# Patient Record
Sex: Female | Born: 1985
Health system: Southern US, Community
[De-identification: ages and names within clinical notes are randomized; demographics above are authoritative.]

## PROBLEM LIST (undated history)

## (undated) ENCOUNTER — Inpatient Hospital Stay (HOSPITAL_COMMUNITY): Payer: Self-pay

## (undated) DIAGNOSIS — K5900 Constipation, unspecified: Secondary | ICD-10-CM

## (undated) DIAGNOSIS — R0602 Shortness of breath: Secondary | ICD-10-CM

## (undated) DIAGNOSIS — I10 Essential (primary) hypertension: Secondary | ICD-10-CM

## (undated) DIAGNOSIS — K829 Disease of gallbladder, unspecified: Secondary | ICD-10-CM

## (undated) DIAGNOSIS — Z789 Other specified health status: Secondary | ICD-10-CM

## (undated) HISTORY — DX: Disease of gallbladder, unspecified: K82.9

## (undated) HISTORY — DX: Shortness of breath: R06.02

## (undated) HISTORY — DX: Essential (primary) hypertension: I10

## (undated) HISTORY — DX: Constipation, unspecified: K59.00

---

## 2008-11-17 HISTORY — PX: CHOLECYSTECTOMY: SHX55

## 2013-04-28 ENCOUNTER — Encounter (HOSPITAL_COMMUNITY): Payer: Self-pay | Admitting: Family Medicine

## 2013-04-28 ENCOUNTER — Emergency Department (HOSPITAL_COMMUNITY)
Admission: EM | Admit: 2013-04-28 | Discharge: 2013-04-28 | Disposition: A | Discharge status: Home / Self Care | Payer: Medicaid Other | Attending: Emergency Medicine | Admitting: Emergency Medicine

## 2013-04-28 DIAGNOSIS — R1310 Dysphagia, unspecified: Secondary | ICD-10-CM | POA: Insufficient documentation

## 2013-04-28 DIAGNOSIS — J029 Acute pharyngitis, unspecified: Secondary | ICD-10-CM | POA: Insufficient documentation

## 2013-04-28 LAB — RAPID STREP SCREEN (MED CTR MEBANE ONLY): Streptococcus, Group A Screen (Direct): NEGATIVE

## 2013-04-28 MED ORDER — HYDROMORPHONE HCL 2 MG PO TABS: 2.0000 mg | ORAL_TABLET | ORAL | Status: DC | PRN

## 2013-04-28 MED ORDER — DEXAMETHASONE 10 MG/ML FOR PEDIATRIC ORAL USE
10.0000 mg | Freq: Once | INTRAMUSCULAR | Status: AC
  Administered 2013-04-28: 10 mg via ORAL
  Filled 2013-04-28: qty 1

## 2013-04-28 MED ORDER — HYDROMORPHONE HCL 2 MG PO TABS
2.0000 mg | ORAL_TABLET | Freq: Once | ORAL | Status: AC
  Administered 2013-04-28: 2 mg via ORAL
  Filled 2013-04-28: qty 1

## 2013-04-28 MED ORDER — OXYCODONE-ACETAMINOPHEN 5-325 MG PO TABS
1.0000 | ORAL_TABLET | Freq: Once | ORAL | Status: AC
  Administered 2013-04-28: 1 via ORAL
  Filled 2013-04-28: qty 1

## 2013-04-28 NOTE — ED Provider Notes (Signed)
History     CSN: [id number]  Arrival date & time 04/28/13  0305   First MD Initiated Contact with Patient 04/28/13 0335      Chief Complaint  Patient presents with  . Sore Throat    (Consider location/radiation/quality/duration/timing/severity/associated sxs/prior treatment) Patient is a 27 y.o. female presenting with pharyngitis. The history is provided by the patient.  Sore Throat  She had onset 2 days ago of sore throat. Pain is severe and she rates it 10/10. It is worse with taking a deep breath or with swallowing. She denies fever, chills, sweats. She tried taking acetaminophen and ibuprofen with no relief of pain. She tried using throat numbing agents which did not help her. She denies any sick contacts. She denies any rhinorrhea or cough.  History reviewed. No pertinent past medical history.  Past Surgical History  Procedure Laterality Date  . Cholecystectomy  2010    Family History  Problem Relation Age of Onset  . Hypertension Mother   . Diabetes Mother   . Hypertension Father     History  Substance Use Topics  . Smoking status: Not on file  . Smokeless tobacco: Not on file  . Alcohol Use: Not on file    OB History   Grav Para Term Preterm Abortions TAB SAB Ect Mult Living                  Review of Systems  All other systems reviewed and are negative.    Allergies  Review of patient's allergies indicates no known allergies.  Home Medications   Current Outpatient Rx  Name  Route  Sig  Dispense  Refill  . Acetaminophen (TYLENOL 8 HOUR PO)   Oral   Take 1 tablet by mouth every 6 (six) hours as needed (pain).         Marland Kitchen ibuprofen (ADVIL,MOTRIN) 200 MG tablet   Oral   Take 200 mg by mouth every 6 (six) hours as needed for pain.         Marland Kitchen OVER THE COUNTER MEDICATION   Oral   Take 2 sprays by mouth every 2 (two) hours as needed (sore throat). Spray for sore throat           BP 144/95  Pulse 72  Temp(Src) 97.8 F (36.6 C) (Oral)   Resp 20  SpO2 99%  LMP 04/18/2013  Physical Exam  Nursing note and vitals reviewed.  27 year old female, resting comfortably and in no acute distress. Vital signs are significant for mild hypertension with blood pressure 144/95. Oxygen saturation is 99%, which is normal. Head is normocephalic and atraumatic. PERRLA, EOMI. Oropharynx shows mild tonsillar erythema and hypertrophy with small areas of exudate are present. She has no difficulty with secretions and phonation is normal. Neck is nontender and supple without or JVD. Bilateral submandibular adenopathy is present Back is nontender and there is no CVA tenderness. Lungs are clear without rales, wheezes, or rhonchi. Chest is nontender. Heart has regular rate and rhythm without murmur. Abdomen is soft, flat, nontender without masses or hepatosplenomegaly and peristalsis is normoactive. Extremities have no cyanosis or edema, full range of motion is present. Skin is warm and dry without rash. Neurologic: Mental status is normal, cranial nerves are intact, there are no motor or sensory deficits.  ED Course  Procedures (including critical care time)  Results for orders placed during the hospital encounter of 04/28/13  RAPID STREP SCREEN      Result Value Range  Streptococcus, Group A Screen (Direct) NEGATIVE  NEGATIVE    1. Viral pharyngitis       MDM  Pharyngitis which is most likely viral. Rapid strep screen will be obtained. She'll be given a dose of dexamethasone. Antibiotics will be withheld unless strep screen is positive.  Strep screen is negative. Culture is pending. She was given a dose of acetaminophen-oxycodone but got no relief of pain. She is given a dose of hydromorphone orally with good relief of pain. She is discharged with a prescription for hydromorphone which is to take as needed.  Dione Booze, MD 04/28/13 [phone]

## 2013-04-28 NOTE — ED Notes (Signed)
Pt comfortable with d/c and f/u isntructions. Prescriptions x1

## 2013-04-28 NOTE — ED Notes (Signed)
Pt reports sharp pain in throat made worse when breathing/coughing x1 day.

## 2013-04-28 NOTE — ED Notes (Signed)
Dr. Glick at bedside.  

## 2013-04-30 LAB — CULTURE, GROUP A STREP

## 2014-01-19 ENCOUNTER — Emergency Department (HOSPITAL_COMMUNITY): Payer: Medicaid Other

## 2014-01-19 ENCOUNTER — Emergency Department (HOSPITAL_COMMUNITY)
Admission: EM | Admit: 2014-01-19 | Discharge: 2014-01-19 | Disposition: A | Discharge status: Home / Self Care | Payer: Medicaid Other | Attending: Emergency Medicine | Admitting: Emergency Medicine

## 2014-01-19 ENCOUNTER — Encounter (HOSPITAL_COMMUNITY): Payer: Self-pay | Admitting: Emergency Medicine

## 2014-01-19 DIAGNOSIS — Z79899 Other long term (current) drug therapy: Secondary | ICD-10-CM | POA: Insufficient documentation

## 2014-01-19 DIAGNOSIS — Z9089 Acquired absence of other organs: Secondary | ICD-10-CM | POA: Insufficient documentation

## 2014-01-19 DIAGNOSIS — R112 Nausea with vomiting, unspecified: Secondary | ICD-10-CM | POA: Insufficient documentation

## 2014-01-19 DIAGNOSIS — F17200 Nicotine dependence, unspecified, uncomplicated: Secondary | ICD-10-CM | POA: Insufficient documentation

## 2014-01-19 DIAGNOSIS — R109 Unspecified abdominal pain: Secondary | ICD-10-CM | POA: Insufficient documentation

## 2014-01-19 LAB — COMPREHENSIVE METABOLIC PANEL
ALK PHOS: 50 U/L (ref 39–117)
ALT: 11 U/L (ref 0–35)
AST: 16 U/L (ref 0–37)
Albumin: 4.4 g/dL (ref 3.5–5.2)
BUN: 7 mg/dL (ref 6–23)
CO2: 22 meq/L (ref 19–32)
Calcium: 9.2 mg/dL (ref 8.4–10.5)
Chloride: 101 mEq/L (ref 96–112)
Creatinine, Ser: 0.6 mg/dL (ref 0.50–1.10)
GLUCOSE: 95 mg/dL (ref 70–99)
POTASSIUM: 3.4 meq/L — AB (ref 3.7–5.3)
SODIUM: 138 meq/L (ref 137–147)
Total Bilirubin: 0.7 mg/dL (ref 0.3–1.2)
Total Protein: 7.1 g/dL (ref 6.0–8.3)

## 2014-01-19 LAB — CBC WITH DIFFERENTIAL/PLATELET
Basophils Absolute: 0 10*3/uL (ref 0.0–0.1)
Basophils Relative: 0 % (ref 0–1)
EOS PCT: 1 % (ref 0–5)
Eosinophils Absolute: 0.1 10*3/uL (ref 0.0–0.7)
HCT: 35.9 % — ABNORMAL LOW (ref 36.0–46.0)
HEMOGLOBIN: 11.7 g/dL — AB (ref 12.0–15.0)
LYMPHS ABS: 1.6 10*3/uL (ref 0.7–4.0)
LYMPHS PCT: 17 % (ref 12–46)
MCH: 29.5 pg (ref 26.0–34.0)
MCHC: 32.6 g/dL (ref 30.0–36.0)
MCV: 90.4 fL (ref 78.0–100.0)
MONOS PCT: 6 % (ref 3–12)
Monocytes Absolute: 0.6 10*3/uL (ref 0.1–1.0)
NEUTROS PCT: 77 % (ref 43–77)
Neutro Abs: 7.5 10*3/uL (ref 1.7–7.7)
PLATELETS: 252 10*3/uL (ref 150–400)
RBC: 3.97 MIL/uL (ref 3.87–5.11)
RDW: 13.2 % (ref 11.5–15.5)
WBC: 9.8 10*3/uL (ref 4.0–10.5)

## 2014-01-19 LAB — URINE MICROSCOPIC-ADD ON

## 2014-01-19 LAB — URINALYSIS, ROUTINE W REFLEX MICROSCOPIC
BILIRUBIN URINE: NEGATIVE
Glucose, UA: NEGATIVE mg/dL
Hgb urine dipstick: NEGATIVE
KETONES UR: NEGATIVE mg/dL
NITRITE: NEGATIVE
PROTEIN: NEGATIVE mg/dL
Specific Gravity, Urine: 1.019 (ref 1.005–1.030)
UROBILINOGEN UA: 1 mg/dL (ref 0.0–1.0)
pH: 8 (ref 5.0–8.0)

## 2014-01-19 LAB — LIPASE, BLOOD: Lipase: 16 U/L (ref 11–59)

## 2014-01-19 MED ORDER — SODIUM CHLORIDE 0.9 % IV SOLN
1000.0000 mL | INTRAVENOUS | Status: DC
  Administered 2014-01-19: 1000 mL via INTRAVENOUS

## 2014-01-19 MED ORDER — IOHEXOL 300 MG/ML  SOLN
100.0000 mL | Freq: Once | INTRAMUSCULAR | Status: AC | PRN
  Administered 2014-01-19: 100 mL via INTRAVENOUS

## 2014-01-19 MED ORDER — ONDANSETRON HCL 4 MG/2ML IJ SOLN
4.0000 mg | Freq: Once | INTRAMUSCULAR | Status: AC
  Administered 2014-01-19: 4 mg via INTRAVENOUS
  Filled 2014-01-19: qty 2

## 2014-01-19 MED ORDER — ONDANSETRON HCL 4 MG/2ML IJ SOLN: 4.0000 mg | Freq: Once | INTRAMUSCULAR | Status: DC

## 2014-01-19 MED ORDER — HYDROMORPHONE HCL PF 1 MG/ML IJ SOLN
1.0000 mg | Freq: Once | INTRAMUSCULAR | Status: AC
  Administered 2014-01-19: 1 mg via INTRAVENOUS
  Filled 2014-01-19: qty 1

## 2014-01-19 MED ORDER — MORPHINE SULFATE 4 MG/ML IJ SOLN
4.0000 mg | Freq: Once | INTRAMUSCULAR | Status: AC
  Administered 2014-01-19: 4 mg via INTRAVENOUS
  Filled 2014-01-19: qty 1

## 2014-01-19 MED ORDER — ONDANSETRON 8 MG PO TBDP: 8.0000 mg | ORAL_TABLET | Freq: Three times a day (TID) | ORAL | Status: DC | PRN

## 2014-01-19 MED ORDER — IOHEXOL 300 MG/ML  SOLN
50.0000 mL | Freq: Once | INTRAMUSCULAR | Status: AC | PRN
  Administered 2014-01-19: 50 mL via ORAL

## 2014-01-19 MED ORDER — OXYCODONE-ACETAMINOPHEN 5-325 MG PO TABS: 1.0000 | ORAL_TABLET | ORAL | Status: DC | PRN

## 2014-01-19 MED ORDER — SODIUM CHLORIDE 0.9 % IV SOLN
1000.0000 mL | Freq: Once | INTRAVENOUS | Status: AC
  Administered 2014-01-19: 1000 mL via INTRAVENOUS

## 2014-01-19 NOTE — ED Provider Notes (Signed)
CSN: [id number]     Arrival date & time 01/19/14  1445 History   First MD Initiated Contact with Patient 01/19/14 1506     Chief Complaint  Patient presents with  . Abdominal Pain      HPI Patient brought to the emergency department because of some upper abdominal pain over the past 3 days with associated nausea and vomiting.  She is status post cholecystectomy many years ago.  She denies diarrhea.  She reports her pain is moderate to severe in severity at this time.  She denies hematemesis.  No melena or hematochezia.  No lower abdominal pain.  No urinary complaints.  No back pain.  No chest pain or shortness of breath.   History reviewed. No pertinent past medical history. Past Surgical History  Procedure Laterality Date  . Cholecystectomy  2010   Family History  Problem Relation Age of Onset  . Hypertension Mother   . Diabetes Mother   . Hypertension Father    History  Substance Use Topics  . Smoking status: Current Every Day Smoker  . Smokeless tobacco: Not on file  . Alcohol Use: No   OB History   Grav Para Term Preterm Abortions TAB SAB Ect Mult Living                 Review of Systems  All other systems reviewed and are negative.      Allergies  Review of patient's allergies indicates no known allergies.  Home Medications   Current Outpatient Rx  Name  Route  Sig  Dispense  Refill  . Levonorgestrel (MIRENA IU)   Intrauterine   1 Device by Intrauterine route daily.         . ondansetron (ZOFRAN ODT) 8 MG disintegrating tablet   Oral   Take 1 tablet (8 mg total) by mouth every 8 (eight) hours as needed for nausea or vomiting.   10 tablet   0   . oxyCODONE-acetaminophen (PERCOCET/ROXICET) 5-325 MG per tablet   Oral   Take 1 tablet by mouth every 4 (four) hours as needed for severe pain.   20 tablet   0    BP 122/61  Pulse 79  Temp(Src) 97.8 F (36.6 C) (Oral)  Resp 18  SpO2 99%  LMP 01/05/2014 Physical Exam  Nursing note and vitals  reviewed. Constitutional: She is oriented to person, place, and time. She appears well-developed and well-nourished. No distress.  HENT:  Head: Normocephalic and atraumatic.  Eyes: EOM are normal.  Neck: Normal range of motion.  Cardiovascular: Normal rate, regular rhythm and normal heart sounds.   Pulmonary/Chest: Effort normal and breath sounds normal.  Abdominal: Soft. She exhibits no distension.  Upper abdominal tenderness without guarding or rebound  Musculoskeletal: Normal range of motion.  Neurological: She is alert and oriented to person, place, and time.  Skin: Skin is warm and dry.  Psychiatric: She has a normal mood and affect. Judgment normal.    ED Course  Procedures (including critical care time) Labs Review Labs Reviewed  CBC WITH DIFFERENTIAL - Abnormal; Notable for the following:    Hemoglobin 11.7 (*)    HCT 35.9 (*)    All other components within normal limits  COMPREHENSIVE METABOLIC PANEL - Abnormal; Notable for the following:    Potassium 3.4 (*)    All other components within normal limits  URINALYSIS, ROUTINE W REFLEX MICROSCOPIC - Abnormal; Notable for the following:    APPearance CLOUDY (*)    Leukocytes, UA  MODERATE (*)    All other components within normal limits  URINE MICROSCOPIC-ADD ON - Abnormal; Notable for the following:    Bacteria, UA FEW (*)    All other components within normal limits  URINE CULTURE  LIPASE, BLOOD  POC URINE PREG, ED   Imaging Review Ct Abdomen Pelvis W Contrast  01/19/2014   CLINICAL DATA:  Abdominal pain for 3 days.  EXAM: CT ABDOMEN AND PELVIS WITH CONTRAST  TECHNIQUE: Multidetector CT imaging of the abdomen and pelvis was performed using the standard protocol following bolus administration of intravenous contrast.  CONTRAST:  26mL OMNIPAQUE IOHEXOL 300 MG/ML SOLN, 154mL OMNIPAQUE IOHEXOL 300 MG/ML SOLN  COMPARISON:  None available for comparison at time of study interpretation.  FINDINGS: Included view of the lung bases  are clear. Visualized heart and pericardium are unremarkable.  Mild intrahepatic biliary dilatation, status post cholecystectomy ; the liver is otherwise unremarkable. Spleen, pancreas and adrenal glands are unremarkable.  The stomach, small and large bowel are normal in course and caliber without inflammatory changes. Normal appendix. Small amount of free fluid in the pelvis, as well as left pericolic gutter with scattered subcentimeter intraperitoneal/peritoneal calcifications. Slightly thickened possibly reactive changes of the omentum along the left anterolateral abdomen.  Kidneys are orthotopic, demonstrating symmetric enhancement without nephrolithiasis, hydronephrosis or renal masses. The unopacified ureters are normal in course and caliber. Delayed imaging through the kidneys demonstrates symmetric prompt excretion to the proximal urinary collecting system. Urinary bladder is partially distended and unremarkable.  Great vessels are normal in course and caliber. No lymphadenopathy by CT size criteria. Intrauterine device in place. Left adnexal 3.5 x 2.6 cm mass with suspected fat fluid level. Soft tissues and included osseous structures are nonsuspicious. Mild broad levoscoliosis could be positional  IMPRESSION: Left adnexal 3.5 x 2.6 cm mass with suspected fat fluid level which may reflect a dermoid. Small amount of ascites could reflect ruptured adnexal cyst.  Scattered peritoneal calcifications associated with some probable reactive changes of the omentum, age indeterminate. Though the calcification could reflect congenital insult, prior infectious or inflammatory process, findings are atypical and follow-up is recommended.  Findings discussed with and reconfirmed by Dr.Katheryne Gorr on 01/19/2014 6:31 PM.   Electronically Signed   By: Elon Alas   On: 01/19/2014 18:55  I personally reviewed the imaging tests through PACS system I reviewed available ER/hospitalization records through the EMR     EKG Interpretation None      MDM   Final diagnoses:  Abdominal pain   7:36 PM Patient feels much better at this time.  Discharge with pain medicine nausea medicine.  Patient is status post cholecystectomy.  Upper abdomen is normal.  She has abnormal left adnexal mass.  She understands the extreme importance for follow up with OB/GYN.  There is also some abnormal scattered peritoneal calcifications and some changes to the omentum which are concerning.  She understands the importance of followup and understands that this could represent changes consistent with cancer and understands that followup is the necessary steps she needs to take to further define her problem in her lower abdomen.  Documents that her lower adnexal issues are related to her symptoms today.    Hoy Morn, MD 01/19/14 [phone]

## 2014-01-19 NOTE — Progress Notes (Signed)
   CARE MANAGEMENT ED NOTE 01/19/2014  Patient:  Toni Sanchez, Toni Sanchez   Account Number:  [id number]  Date Initiated:  01/19/2014  Documentation initiated by:  Livia Snellen  Subjective/Objective Assessment:   Patient presents to ED abdominal pain and n/v for three days     Subjective/Objective Assessment Detail:     Action/Plan:   Action/Plan Detail:   Anticipated DC Date:       Status Recommendation to Physician:   Result of Recommendation:    Other ED [place]  Other  PCP issues    Choice offered to / List presented to:            Status of service:  Completed, signed off  ED Comments:   ED Comments Detail:  Patient confirms her pcp is at [facility]. System updated.

## 2014-01-19 NOTE — ED Notes (Signed)
Per [facility] upper right and left abdominal pain for 3 days-N/V no diarrhea-BP elevated

## 2014-01-20 LAB — URINE CULTURE
COLONY COUNT: NO GROWTH
CULTURE: NO GROWTH

## 2014-02-27 ENCOUNTER — Encounter: Payer: Self-pay | Admitting: Family Medicine

## 2014-02-27 ENCOUNTER — Other Ambulatory Visit (HOSPITAL_COMMUNITY)
Admission: RE | Admit: 2014-02-27 | Discharge: 2014-02-27 | Disposition: A | Discharge status: Home / Self Care | Payer: Medicaid Other | Source: Ambulatory Visit | Attending: Family Medicine | Admitting: Family Medicine

## 2014-02-27 ENCOUNTER — Ambulatory Visit (INDEPENDENT_AMBULATORY_CARE_PROVIDER_SITE_OTHER): Payer: Medicaid Other | Admitting: Family Medicine

## 2014-02-27 VITALS — BP 134/95 | HR 91 | Temp 98.9°F | Ht 69.0 in | Wt 222.1 lb

## 2014-02-27 DIAGNOSIS — Z01419 Encounter for gynecological examination (general) (routine) without abnormal findings: Secondary | ICD-10-CM

## 2014-02-27 DIAGNOSIS — Z113 Encounter for screening for infections with a predominantly sexual mode of transmission: Secondary | ICD-10-CM | POA: Insufficient documentation

## 2014-02-27 DIAGNOSIS — D271 Benign neoplasm of left ovary: Secondary | ICD-10-CM

## 2014-02-27 DIAGNOSIS — Z124 Encounter for screening for malignant neoplasm of cervix: Secondary | ICD-10-CM | POA: Insufficient documentation

## 2014-02-27 DIAGNOSIS — D279 Benign neoplasm of unspecified ovary: Secondary | ICD-10-CM

## 2014-02-27 NOTE — Progress Notes (Signed)
GYN OFFICE NOTE  Chief Complaint:  LLQ pain  Primary Care Physician: PROVIDER NOT IN SYSTEM  HPI:  Toni Sanchez is a 28 yo G1P1001 who presents today as an ED f/u for left lower quadrant abdominal pain.   - was seen in the ED 3/5  - had a CT at that time that showed 3.5x2.6cm mass with suspected fat fluid level taht may reflect a dermoid  Pain is worst 2 days before menstrual cycle, through her menstural cycle and then 2-3days after. In total about 1 week out of every month Nothing seems to make it better or worse -sharp pains that come and go   Has a hx of irregular periods but now have been regular. Every month. Very light flow. Last 3 days.   Sexually active. One partner. Has a mirena in place.   No fevers, chills, nausea, vomiting, diarrhea, constipation, chest pain, sob, headaches.    PMHx:  History reviewed. No pertinent past medical history.  Past Surgical History  Procedure Laterality Date  . Cholecystectomy  2010    FAMHx:  Family History  Problem Relation Age of Onset  . Hypertension Mother   . Diabetes Mother   . Hypertension Father     SOCHx:   reports that she has been smoking Cigarettes.  She has been smoking about 0.50 packs per day. She has never used smokeless tobacco. She reports that she uses illicit drugs (Marijuana). She reports that she does not drink alcohol.  ALLERGIES:  No Known Allergies  ROS: Pertinent ROS as seen in HPI. Otherwise negative.   HOME MEDS: Current Outpatient Prescriptions  Medication Sig Dispense Refill  . Levonorgestrel (MIRENA IU) 1 Device by Intrauterine route daily.      . ondansetron (ZOFRAN ODT) 8 MG disintegrating tablet Take 1 tablet (8 mg total) by mouth every 8 (eight) hours as needed for nausea or vomiting.  10 tablet  0  . oxyCODONE-acetaminophen (PERCOCET/ROXICET) 5-325 MG per tablet Take 1 tablet by mouth every 4 (four) hours as needed for severe pain.  20 tablet  0   No current facility-administered  medications for this visit.    LABS/IMAGING: 01/19/14 CT abd/pelvis IMPRESSION:   Left adnexal 3.5 x 2.6 cm mass with suspected fat fluid level which  may reflect a dermoid. Small amount of ascites could reflect  ruptured adnexal cyst.   Scattered peritoneal calcifications associated with some probable  reactive changes of the omentum, age indeterminate. Though the  calcification could reflect congenital insult, prior infectious or  inflammatory process, findings are atypical and follow-up is  recommended.    VITALS: BP 134/95  Pulse 91  Temp(Src) 98.9 F (37.2 C)  Ht 5\' 9"  (1.753 m)  Wt 222 lb 1.6 oz (100.744 kg)  BMI 32.78 kg/m2  LMP 02/07/2014  EXAM: GENERAL: Well-developed, well-nourished female in no acute distress.  HEENT: Normocephalic, atraumatic. Sclerae anicteric.  NECK: Supple. Normal thyroid.  LUNGS: Clear to auscultation bilaterally.  HEART: Regular rate and rhythm. BREASTS: deferred ABDOMEN: Soft, nontender, nondistended. No organomegaly. PELVIC: Normal external female genitalia. Vagina is pink and rugated.  Normal discharge. Normal cervix contour. Pap smear obtained. Uterus is normal in size. Left adnexal tenderness and fullness. Right side normal. .  EXTREMITIES: No cyanosis, clubbing, or edema, 2+ distal pulses.   ASSESSMENT: Well woman exam with routine gynecological exam - Plan: Cytology - PAP  Dermoid cyst of left ovary  PLAN:  1) pap obtained today   2) dermoid cyst  - discussed that  she will likely need surgical excision  - discussed diagnosis and reason for removal.  - will reschedule with surgeon in order to schedule surgery.    Kassie Mends, MD

## 2014-03-02 ENCOUNTER — Telehealth: Payer: Self-pay

## 2014-03-02 NOTE — Telephone Encounter (Signed)
Message copied by Geanie Logan on Thu Mar 02, 2014  8:31 AM ------      Message from: Kassie Mends      Created: Wed Mar 01, 2014  8:01 PM       Hi ladies. This patient is positive for both chlamydia and gonorrhea and needs to come in and be treated. Can you let her know and go ahead and put in abx to be given when she gets here?  Thanks. ------

## 2014-03-02 NOTE — Telephone Encounter (Addendum)
Attempted to call pt. No answer. Left message stating we are calling with results, it is important, please call clinic at earliest convenience.  4/17  1222  Called pt and left message that I am calling with important test result information. Please call back on Monday and leave a message stating whether we may leave your results information on your voice mail.

## 2014-03-06 NOTE — Telephone Encounter (Signed)
Called patient and informed her of results and need to come in for treatment. Patient stated she could come in 4/27 @ 9am. Also discussed with patient that this is an STD and it's very important you and your partner(s) get treated and to abstain from intercourse a week after treatment. Patient verbalized understanding to all and had no further questions. Std card sent

## 2014-03-08 ENCOUNTER — Encounter: Payer: Self-pay | Admitting: *Deleted

## 2014-03-13 ENCOUNTER — Ambulatory Visit: Payer: Medicaid Other

## 2014-03-27 ENCOUNTER — Ambulatory Visit: Payer: Medicaid Other | Admitting: Obstetrics & Gynecology

## 2014-04-10 ENCOUNTER — Encounter (HOSPITAL_COMMUNITY): Payer: Self-pay | Admitting: Emergency Medicine

## 2014-04-10 ENCOUNTER — Emergency Department (HOSPITAL_COMMUNITY)
Admission: EM | Admit: 2014-04-10 | Discharge: 2014-04-11 | Disposition: A | Discharge status: Home / Self Care | Payer: Medicaid Other | Attending: Emergency Medicine | Admitting: Emergency Medicine

## 2014-04-10 DIAGNOSIS — L539 Erythematous condition, unspecified: Secondary | ICD-10-CM | POA: Insufficient documentation

## 2014-04-10 DIAGNOSIS — R21 Rash and other nonspecific skin eruption: Secondary | ICD-10-CM | POA: Insufficient documentation

## 2014-04-10 DIAGNOSIS — F17200 Nicotine dependence, unspecified, uncomplicated: Secondary | ICD-10-CM | POA: Insufficient documentation

## 2014-04-10 DIAGNOSIS — Z79899 Other long term (current) drug therapy: Secondary | ICD-10-CM | POA: Insufficient documentation

## 2014-04-10 NOTE — ED Notes (Signed)
Pt states she has noticed a rash on both of her forearms for the past three days.  No SOB of distress noted

## 2014-04-11 MED ORDER — HYDROCORTISONE 1 % EX CREA: 1.0000 "application " | TOPICAL_CREAM | Freq: Two times a day (BID) | CUTANEOUS | Status: DC

## 2014-04-11 MED ORDER — HYDROXYZINE PAMOATE 25 MG PO CAPS: 100.0000 mg | ORAL_CAPSULE | Freq: Four times a day (QID) | ORAL | Status: DC | PRN

## 2014-04-11 NOTE — Discharge Instructions (Signed)
Call for a follow up appointment with a Family or Primary Care Provider.  Call for an appointment with a dermatologist. Return if Symptoms worsen.   Take medication as prescribed.  Avoid harsh soaps, lotions that might increase discomfort to the area. Avoid itching and scratching your rash.

## 2014-04-11 NOTE — ED Provider Notes (Signed)
Medical screening examination/treatment/procedure(s) were performed by non-physician practitioner and as supervising physician I was immediately available for consultation/collaboration.    Johnna Acosta, MD 04/11/14 [phone]

## 2014-04-11 NOTE — ED Provider Notes (Signed)
CSN: [id number]     Arrival date & time 04/10/14  2125 History   First MD Initiated Contact with Patient 04/10/14 2247     Chief Complaint  Patient presents with  . Rash     (Consider location/radiation/quality/duration/timing/severity/associated sxs/prior Treatment) HPI Comments: Patient presents with a diffuse rash. Reports a new couch but no other recent contacts. No other family members or household members with similar complaints.  Patient is a 28 y.o. female presenting with rash. The history is provided by the patient. No language interpreter was used.  Rash Location:  Full body Quality: burning, itchiness, redness and swelling   Quality: not weeping   Severity:  Moderate Onset quality:  Gradual Duration:  3 days Timing:  Constant Progression:  Spreading Chronicity:  New Context: not animal contact, not chemical exposure, not exposure to similar rash, not food, not insect bite/sting, not medications, not new detergent/soap and not plant contact   Relieved by:  Anti-itch cream and antihistamines Associated symptoms: no fever, no induration, no joint pain and no shortness of breath     History reviewed. No pertinent past medical history. Past Surgical History  Procedure Laterality Date  . Cholecystectomy  2010   Family History  Problem Relation Age of Onset  . Hypertension Mother   . Diabetes Mother   . Hypertension Father    History  Substance Use Topics  . Smoking status: Current Every Day Smoker -- 0.50 packs/day    Types: Cigarettes  . Smokeless tobacco: Never Used  . Alcohol Use: No   OB History   Grav Para Term Preterm Abortions TAB SAB Ect Mult Living                 Review of Systems  Constitutional: Negative for fever and chills.  Respiratory: Negative for shortness of breath.   Musculoskeletal: Negative for arthralgias.  Skin: Positive for rash.  All other systems reviewed and are negative.     Allergies  Review of patient's allergies  indicates no known allergies.  Home Medications   Prior to Admission medications   Medication Sig Start Date End Date Taking? Authorizing Provider  diphenhydrAMINE (SOMINEX) 25 MG tablet Take 25 mg by mouth every 6 (six) hours as needed for itching or sleep.   Yes Historical Provider, MD  Levonorgestrel (MIRENA IU) 1 Device by Intrauterine route daily.   Yes Historical Provider, MD   BP 135/82  Pulse 78  Temp(Src) 98.6 F (37 C) (Oral)  Resp 16  SpO2 99% Physical Exam  Nursing note and vitals reviewed. Constitutional: She is oriented to person, place, and time. She appears well-developed and well-nourished.  Non-toxic appearance. She does not have a sickly appearance. She does not appear ill. No distress.  HENT:  Head: Normocephalic and atraumatic.  Eyes: EOM are normal. Pupils are equal, round, and reactive to light.  Neck: Neck supple.  Pulmonary/Chest: Effort normal. No respiratory distress.  Musculoskeletal: Normal range of motion.  Neurological: She is alert and oriented to person, place, and time.  Skin: Skin is warm and dry. Rash noted. She is not diaphoretic.  Diffuse raised erythema lesions in patches to bilateral upper extremities, trunk. No surrounding erythema or signs of a secondary infection.  Palms without rash.  Psychiatric: She has a normal mood and affect. Her behavior is normal. Thought content normal.    ED Course  Procedures (including critical care time) Labs Review Labs Reviewed - No data to display  Imaging Review No results found.  EKG Interpretation None      MDM   Final diagnoses:  Rash   Patient with a diffuse rash, similar to contact dermatitis but no known etiology. Will discharge patient with anti-itch medication and cortisone cream. Advised patient to follow with her PCP Dr. Horton Finer, and dermatology. Discussed treatment plan with the patient. Return precautions given. Reports understanding and no other concerns at this time.  Patient is  stable for discharge at this time.  Meds given in ED:  Medications - No data to display  New Prescriptions   HYDROCORTISONE CREAM 1 %    Apply 1 application topically 2 (two) times daily. Do not apply to face   HYDROXYZINE (VISTARIL) 25 MG CAPSULE    Take 4 capsules (100 mg total) by mouth 4 (four) times daily as needed for itching.        Lorrine Kin, PA-C 04/11/14 0020

## 2014-05-08 ENCOUNTER — Ambulatory Visit (INDEPENDENT_AMBULATORY_CARE_PROVIDER_SITE_OTHER): Payer: Medicaid Other | Admitting: Obstetrics & Gynecology

## 2014-05-08 ENCOUNTER — Encounter: Payer: Self-pay | Admitting: Obstetrics & Gynecology

## 2014-05-08 VITALS — BP 131/77 | HR 71 | Temp 97.6°F | Ht 69.0 in | Wt 219.5 lb

## 2014-05-08 DIAGNOSIS — N83209 Unspecified ovarian cyst, unspecified side: Secondary | ICD-10-CM

## 2014-05-08 NOTE — Patient Instructions (Signed)
Ovarian Cyst An ovarian cyst is a fluid-filled sac that forms on an ovary. The ovaries are small organs that produce eggs in women. Various types of cysts can form on the ovaries. Most are not cancerous. Many do not cause problems, and they often go away on their own. Some may cause symptoms and require treatment. Common types of ovarian cysts include:  Functional cysts--These cysts may occur every month during the menstrual cycle. This is normal. The cysts usually go away with the next menstrual cycle if the woman does not get pregnant. Usually, there are no symptoms with a functional cyst.  Endometrioma cysts--These cysts form from the tissue that lines the uterus. They are also called "chocolate cysts" because they become filled with blood that turns brown. This type of cyst can cause pain in the lower abdomen during intercourse and with your menstrual period.  Cystadenoma cysts--This type develops from the cells on the outside of the ovary. These cysts can get very big and cause lower abdomen pain and pain with intercourse. This type of cyst can twist on itself, cut off its blood supply, and cause severe pain. It can also easily rupture and cause a lot of pain.  Dermoid cysts--This type of cyst is sometimes found in both ovaries. These cysts may contain different kinds of body tissue, such as skin, teeth, hair, or cartilage. They usually do not cause symptoms unless they get very big.  Theca lutein cysts--These cysts occur when too much of a certain hormone (human chorionic gonadotropin) is produced and overstimulates the ovaries to produce an egg. This is most common after procedures used to assist with the conception of a baby (in vitro fertilization). CAUSES   Fertility drugs can cause a condition in which multiple large cysts are formed on the ovaries. This is called ovarian hyperstimulation syndrome.  A condition called polycystic ovary syndrome can cause hormonal imbalances that can lead to  nonfunctional ovarian cysts. SIGNS AND SYMPTOMS  Many ovarian cysts do not cause symptoms. If symptoms are present, they may include:  Pelvic pain or pressure.  Pain in the lower abdomen.  Pain during sexual intercourse.  Increasing girth (swelling) of the abdomen.  Abnormal menstrual periods.  Increasing pain with menstrual periods.  Stopping having menstrual periods without being pregnant. DIAGNOSIS  These cysts are commonly found during a routine or annual pelvic exam. Tests may be ordered to find out more about the cyst. These tests may include:  Ultrasound.  X-ray of the pelvis.  CT scan.  MRI.  Blood tests. TREATMENT  Many ovarian cysts go away on their own without treatment. Your health care provider may want to check your cyst regularly for 2-3 months to see if it changes. For women in menopause, it is particularly important to monitor a cyst closely because of the higher rate of ovarian cancer in menopausal women. When treatment is needed, it may include any of the following:  A procedure to drain the cyst (aspiration). This may be done using a long needle and ultrasound. It can also be done through a laparoscopic procedure. This involves using a thin, lighted tube with a tiny camera on the end (laparoscope) inserted through a small incision.  Surgery to remove the whole cyst. This may be done using laparoscopic surgery or an open surgery involving a larger incision in the lower abdomen.  Hormone treatment or birth control pills. These methods are sometimes used to help dissolve a cyst. HOME CARE INSTRUCTIONS   Only take over-the-counter   or prescription medicines as directed by your health care provider.  Follow up with your health care provider as directed.  Get regular pelvic exams and Pap tests. SEEK MEDICAL CARE IF:   Your periods are late, irregular, or painful, or they stop.  Your pelvic pain or abdominal pain does not go away.  Your abdomen becomes  larger or swollen.  You have pressure on your bladder or trouble emptying your bladder completely.  You have pain during sexual intercourse.  You have feelings of fullness, pressure, or discomfort in your stomach.  You lose weight for no apparent reason.  You feel generally ill.  You become constipated.  You lose your appetite.  You develop acne.  You have an increase in body and facial hair.  You are gaining weight, without changing your exercise and eating habits.  You think you are pregnant. SEEK IMMEDIATE MEDICAL CARE IF:   You have increasing abdominal pain.  You feel sick to your stomach (nauseous), and you throw up (vomit).  You develop a fever that comes on suddenly.  You have abdominal pain during a bowel movement.  Your menstrual periods become heavier than usual. MAKE SURE YOU:  Understand these instructions.  Will watch your condition.  Will get help right away if you are not doing well or get worse. Document Released: 11/03/2005 Document Revised: 11/08/2013 Document Reviewed: 07/11/2013 Exit[employer] Patient Information 2015 [employer]. This information is not intended to replace advice given to you by your health care provider. Make sure you discuss any questions you have with your health care provider.  

## 2014-05-08 NOTE — Progress Notes (Signed)
Patient ID: Toni Sanchez, female   DOB: 1986-10-02, 28 y.o.   MRN: [id number] G1P1 Patient's last menstrual period was 04/08/2014. F/U for possible dermoid. No c/o pain.  CLINICAL DATA: Abdominal pain for 3 days.  EXAM:  CT ABDOMEN AND PELVIS WITH CONTRAST  TECHNIQUE:  Multidetector CT imaging of the abdomen and pelvis was performed  using the standard protocol following bolus administration of  intravenous contrast.  CONTRAST: 38mL OMNIPAQUE IOHEXOL 300 MG/ML SOLN, 141mL OMNIPAQUE  IOHEXOL 300 MG/ML SOLN  COMPARISON: None available for comparison at time of study  interpretation.  FINDINGS:  Included view of the lung bases are clear. Visualized heart and  pericardium are unremarkable.  Mild intrahepatic biliary dilatation, status post cholecystectomy ;  the liver is otherwise unremarkable. Spleen, pancreas and adrenal  glands are unremarkable.  The stomach, small and large bowel are normal in course and caliber  without inflammatory changes. Normal appendix. Small amount of free  fluid in the pelvis, as well as left pericolic gutter with scattered  subcentimeter intraperitoneal/peritoneal calcifications. Slightly  thickened possibly reactive changes of the omentum along the left  anterolateral abdomen.  Kidneys are orthotopic, demonstrating symmetric enhancement without  nephrolithiasis, hydronephrosis or renal masses. The unopacified  ureters are normal in course and caliber. Delayed imaging through  the kidneys demonstrates symmetric prompt excretion to the proximal  urinary collecting system. Urinary bladder is partially distended  and unremarkable.  Great vessels are normal in course and caliber. No lymphadenopathy  by CT size criteria. Intrauterine device in place. Left adnexal 3.5  x 2.6 cm mass with suspected fat fluid level. Soft tissues and  included osseous structures are nonsuspicious. Mild broad  levoscoliosis could be positional  IMPRESSION:  Left adnexal 3.5 x  2.6 cm mass with suspected fat fluid level which  may reflect a dermoid. Small amount of ascites could reflect  ruptured adnexal cyst.  Scattered peritoneal calcifications associated with some probable  reactive changes of the omentum, age indeterminate. Though the  calcification could reflect congenital insult, prior infectious or  inflammatory process, findings are atypical and follow-up is  recommended.  Findings discussed with and reconfirmed by Dr.KEVIN CAMPOS on  01/19/2014 6:31 PM.  Electronically Signed  By: Elon Alas  On: 01/19/2014 18:55  Imp: f/u US needed, wants to have Mirena replaced, will schedule   Woodroe Mode, MD 05/08/2014

## 2014-05-16 ENCOUNTER — Ambulatory Visit (HOSPITAL_COMMUNITY): Payer: Medicaid Other

## 2014-07-20 ENCOUNTER — Ambulatory Visit: Payer: Medicaid Other | Admitting: Nurse Practitioner

## 2014-08-04 ENCOUNTER — Ambulatory Visit: Payer: Medicaid Other | Admitting: Medical

## 2014-11-13 ENCOUNTER — Telehealth: Payer: Self-pay | Admitting: *Deleted

## 2014-11-13 DIAGNOSIS — R102 Pelvic and perineal pain: Principal | ICD-10-CM

## 2014-11-13 DIAGNOSIS — O26899 Other specified pregnancy related conditions, unspecified trimester: Secondary | ICD-10-CM

## 2014-11-13 NOTE — Telephone Encounter (Signed)
Called and rescheduled ultrasound for 11/23/14 at 9:15. Called Toni Sanchez to notify of appointment.  Unable to leave message- heard phone ring multiple time, but no voicemail.

## 2014-11-13 NOTE — Telephone Encounter (Signed)
-----   Message from Woodroe Mode, MD sent at 11/11/2014  5:24 PM EST ----- Reschedule [place] and call pt please

## 2014-11-14 ENCOUNTER — Encounter: Payer: Self-pay | Admitting: *Deleted

## 2014-11-14 NOTE — Telephone Encounter (Signed)
Called pt to notify of [place] appt - phone rang but no answer - unable to leave message.

## 2014-11-14 NOTE — Telephone Encounter (Signed)
Attempted to contact patient, no answer, unable to leave a message.  Will send a certified letter.  Letter sent.

## 2014-11-23 ENCOUNTER — Ambulatory Visit (HOSPITAL_COMMUNITY): Payer: Self-pay | Attending: Obstetrics & Gynecology

## 2015-03-19 ENCOUNTER — Ambulatory Visit: Payer: Self-pay | Admitting: Obstetrics & Gynecology

## 2015-04-12 ENCOUNTER — Ambulatory Visit: Payer: Self-pay | Admitting: Medical

## 2016-07-12 ENCOUNTER — Emergency Department (HOSPITAL_COMMUNITY)
Admission: EM | Admit: 2016-07-12 | Discharge: 2016-07-12 | Disposition: A | Discharge status: Home / Self Care | Payer: Managed Care, Other (non HMO) | Attending: Emergency Medicine | Admitting: Emergency Medicine

## 2016-07-12 ENCOUNTER — Encounter (HOSPITAL_COMMUNITY): Payer: Self-pay | Admitting: Emergency Medicine

## 2016-07-12 DIAGNOSIS — Y999 Unspecified external cause status: Secondary | ICD-10-CM | POA: Diagnosis not present

## 2016-07-12 DIAGNOSIS — Y929 Unspecified place or not applicable: Secondary | ICD-10-CM | POA: Insufficient documentation

## 2016-07-12 DIAGNOSIS — Z79899 Other long term (current) drug therapy: Secondary | ICD-10-CM | POA: Insufficient documentation

## 2016-07-12 DIAGNOSIS — F17210 Nicotine dependence, cigarettes, uncomplicated: Secondary | ICD-10-CM | POA: Insufficient documentation

## 2016-07-12 DIAGNOSIS — W25XXXA Contact with sharp glass, initial encounter: Secondary | ICD-10-CM | POA: Diagnosis not present

## 2016-07-12 DIAGNOSIS — Z23 Encounter for immunization: Secondary | ICD-10-CM | POA: Diagnosis not present

## 2016-07-12 DIAGNOSIS — S61512A Laceration without foreign body of left wrist, initial encounter: Secondary | ICD-10-CM | POA: Diagnosis not present

## 2016-07-12 DIAGNOSIS — Y939 Activity, unspecified: Secondary | ICD-10-CM | POA: Diagnosis not present

## 2016-07-12 DIAGNOSIS — IMO0002 Reserved for concepts with insufficient information to code with codable children: Secondary | ICD-10-CM

## 2016-07-12 MED ORDER — LIDOCAINE HCL 2 % IJ SOLN
20.0000 mL | Freq: Once | INTRAMUSCULAR | Status: AC
  Administered 2016-07-12: 400 mg
  Filled 2016-07-12: qty 20

## 2016-07-12 MED ORDER — TETANUS-DIPHTH-ACELL PERTUSSIS 5-2.5-18.5 LF-MCG/0.5 IM SUSP
0.5000 mL | Freq: Once | INTRAMUSCULAR | Status: AC
  Administered 2016-07-12: 0.5 mL via INTRAMUSCULAR
  Filled 2016-07-12: qty 0.5

## 2016-07-12 NOTE — ED Triage Notes (Signed)
Pt. Stated, I cut my wrist on a piece of glass

## 2016-07-12 NOTE — Discharge Instructions (Signed)
Please monitor for signs of infection. Return immediately if any present.

## 2016-07-12 NOTE — ED Provider Notes (Signed)
[facility] DEPT Provider Note   CSN: [id number] Arrival date & time: 07/12/16  1050  By signing my name below, I, Shanna Cisco, attest that this documentation has been prepared under the direction and in the presence of [employer], PA-C. Electronically signed by: Shanna Cisco, ED Scribe. 07/12/16. 12:29 PM.     History   Chief Complaint Chief Complaint  Patient presents with  . Laceration    left wrist    The history is provided by the patient. No language interpreter was used.   HPI Comments:  Toni Sanchez is a 30 y.o. female who presents to the Emergency Department complaining of laceration to left wrist, which occurred a couple hours ago. Pt reports that her left wrist when through some glass. Associated symptoms include pain with movement of wrist. Denies decreased strength or sensation or inability to move wrist.  History reviewed. No pertinent past medical history.  Patient Active Problem List   Diagnosis Date Noted  . Other and unspecified ovarian cyst 05/08/2014    Past Surgical History:  Procedure Laterality Date  . CHOLECYSTECTOMY  2010    OB History    No data available       Home Medications    Prior to Admission medications   Medication Sig Start Date End Date Taking? Authorizing Provider  hydrocortisone cream 1 % Apply 1 application topically 2 (two) times daily. Do not apply to face 04/11/14   Harvie Heck, PA-C  hydrOXYzine (VISTARIL) 25 MG capsule Take 4 capsules (100 mg total) by mouth 4 (four) times daily as needed for itching. 04/11/14   Harvie Heck, PA-C  Levonorgestrel (MIRENA IU) 1 Device by Intrauterine route daily.    Historical Provider, MD    Family History Family History  Problem Relation Age of Onset  . Hypertension Mother   . Diabetes Mother   . Hypertension Father     Social History Social History  Substance Use Topics  . Smoking status: Current Every Day Smoker    Packs/day: 0.50    Types: Cigarettes  .  Smokeless tobacco: Never Used  . Alcohol use No     Allergies   Review of patient's allergies indicates no known allergies.   Review of Systems Review of Systems  Skin: Positive for wound ( laceration on left wrist).  Neurological: Negative for weakness and numbness.  All other systems reviewed and are negative.    Physical Exam Updated Vital Signs BP 122/76 (BP Location: Right Arm)   Pulse 67   Temp 98.1 F (36.7 C) (Oral)   Resp 18   Ht 5\' 9"  (1.753 m)   Wt 99.8 kg   LMP 06/26/2016   SpO2 100%   BMI 32.49 kg/m   Physical Exam  Constitutional: She is oriented to person, place, and time. She appears well-developed and well-nourished. No distress.  HENT:  Head: Normocephalic and atraumatic.  Eyes: Conjunctivae are normal. Right eye exhibits no discharge. Left eye exhibits no discharge. No scleral icterus.  Cardiovascular: Normal rate.   Pulmonary/Chest: Effort normal.  Neurological: She is alert and oriented to person, place, and time. Coordination normal.  Skin: Skin is warm and dry. No rash noted. She is not diaphoretic. No erythema. No pallor.  2 cm laceration to the left radial wrist. Full active ROM. No deep space involvement. Bleeding controlled.   Psychiatric: She has a normal mood and affect. Her behavior is normal.  Nursing note and vitals reviewed.    ED Treatments / Results  DIAGNOSTIC STUDIES:  Oxygen Saturation is 100% on room air, normal by my interpretation.    COORDINATION OF CARE:  12:06 PM Discussed treatment plan with pt at bedside, which includes suturing of site, and pt agreed to plan.  Labs (all labs ordered are listed, but only abnormal results are displayed) Labs Reviewed - No data to display  EKG  EKG Interpretation None       Radiology No results found.  Procedures .Marland KitchenLaceration Repair Date/Time: 07/12/2016 1:58 PM Performed by: Penni Bombard, Orchid Glassberg Authorized by: Penni Bombard, Kaytee Taliercio    LACERATION REPAIR Performed by:  Elmer Ramp Authorized by: Elmer Ramp Consent: Verbal consent obtained. Risks and benefits: risks, benefits and alternatives were discussed Consent given by: patient Patient identity confirmed: provided demographic data Prepped and Draped in normal sterile fashion Wound explored  Laceration Location: left volar wrist  Laceration Length: 2cm  No Foreign Bodies seen or palpated  Anesthesia: local infiltration  Local anesthetic: lidocaine 2% 0 epinephrine  Anesthetic total: 3 ml  Irrigation method: syringe Amount of cleaning: standard  Skin closure: simple  Number of sutures: 6  Technique: simple interrupted   Patient tolerance: Patient tolerated the procedure well with no immediate complications.   Medications Ordered in ED Medications  lidocaine (XYLOCAINE) 2 % (with pres) injection 400 mg (400 mg Infiltration Given 07/12/16 1212)  Tdap (BOOSTRIX) injection 0.5 mL (0.5 mLs Intramuscular Given 07/12/16 1212)     Initial Impression / Assessment and Plan / ED Course  I have reviewed the triage vital signs and the nursing notes.  Pertinent labs & imaging results that were available during my care of the patient were reviewed by me and considered in my medical decision making (see chart for details).  Clinical Course    Labs:   Imaging:   Consults:   Therapeutics: Suturing  Discharge Meds:  Assessment/Plan: Simple laceration no deep space involvement, no foreign bodies. Wound care instructions given , strict return precautions given. Pt verbalized understanding and agreement to today's plan and had no further questions at discharge.  Final Clinical Impressions(s) / ED Diagnoses   Final diagnoses:  Laceration    New Prescriptions Discharge Medication List as of 07/12/2016  1:11 PM    I personally performed the services described in this documentation, which was scribed in my presence. The recorded information has been reviewed and is  accurate.      Okey Regal, PA-C 07/12/16 1359    Orlie Dakin, MD 07/12/16 [phone]

## 2016-10-20 ENCOUNTER — Ambulatory Visit: Payer: Managed Care, Other (non HMO) | Admitting: Medical

## 2016-10-30 ENCOUNTER — Encounter (HOSPITAL_COMMUNITY): Payer: Self-pay | Admitting: *Deleted

## 2016-10-30 ENCOUNTER — Ambulatory Visit (HOSPITAL_COMMUNITY)
Admission: EM | Admit: 2016-10-30 | Discharge: 2016-10-30 | Disposition: A | Discharge status: Home / Self Care | Payer: Managed Care, Other (non HMO) | Attending: Emergency Medicine | Admitting: Emergency Medicine

## 2016-10-30 DIAGNOSIS — K529 Noninfective gastroenteritis and colitis, unspecified: Secondary | ICD-10-CM

## 2016-10-30 DIAGNOSIS — R112 Nausea with vomiting, unspecified: Secondary | ICD-10-CM

## 2016-10-30 DIAGNOSIS — R197 Diarrhea, unspecified: Secondary | ICD-10-CM | POA: Diagnosis not present

## 2016-10-30 MED ORDER — ONDANSETRON 4 MG PO TBDP
ORAL_TABLET | ORAL | Status: AC
  Filled 2016-10-30: qty 1

## 2016-10-30 MED ORDER — ONDANSETRON 4 MG PO TBDP
4.0000 mg | ORAL_TABLET | Freq: Once | ORAL | Status: AC
  Administered 2016-10-30: 4 mg via ORAL

## 2016-10-30 MED ORDER — ONDANSETRON HCL 4 MG PO TABS: 4.0000 mg | ORAL_TABLET | Freq: Four times a day (QID) | ORAL | 0 refills | Status: DC

## 2016-10-30 NOTE — ED Provider Notes (Signed)
CSN: [id number]     Arrival date & time 10/30/16  1002 History   First MD Initiated Contact with Patient 10/30/16 1053     Chief Complaint  Patient presents with  . Emesis   (Consider location/radiation/quality/duration/timing/severity/associated sxs/prior Treatment) 30 year old female states she awoke this morning around 2:30 AM with nausea and vomiting. She states she vomited several times during the night and this morning. She also had one episode of watery diarrhea. She is complaining of intermittent crampy abdominal pain across the mid abdomen. She later  develop chills. Denies upper respiratory congestion symptoms.      History reviewed. No pertinent past medical history. Past Surgical History:  Procedure Laterality Date  . CHOLECYSTECTOMY  2010   Family History  Problem Relation Age of Onset  . Hypertension Mother   . Diabetes Mother   . Hypertension Father    Social History  Substance Use Topics  . Smoking status: Current Every Day Smoker    Packs/day: 0.50    Types: Cigarettes  . Smokeless tobacco: Never Used  . Alcohol use No   OB History    No data available     Review of Systems  Constitutional: Positive for activity change and chills. Negative for fever.  HENT: Negative.   Respiratory: Negative.  Negative for cough and shortness of breath.   Cardiovascular: Negative for chest pain.  Gastrointestinal: Positive for abdominal pain, diarrhea, nausea and vomiting. Negative for blood in stool and constipation.  Genitourinary: Negative.   Neurological: Negative.   All other systems reviewed and are negative.   Allergies  Patient has no known allergies.  Home Medications   Prior to Admission medications   Medication Sig Start Date End Date Taking? Authorizing Provider  Levonorgestrel (MIRENA IU) 1 Device by Intrauterine route daily.    Historical Provider, MD  ondansetron (ZOFRAN) 4 MG tablet Take 1 tablet (4 mg total) by mouth every 6 (six) hours. Prn N  or V. 10/30/16   Janne Napoleon, NP   Meds Ordered and Administered this Visit   Medications  ondansetron (ZOFRAN-ODT) disintegrating tablet 4 mg (4 mg Oral Given 10/30/16 1103)    BP 115/75 (BP Location: Left Arm)   Pulse 102   Temp 99 F (37.2 C) (Oral)   Resp 14   LMP 10/11/2016   SpO2 98%  No data found.   Physical Exam  Constitutional: She is oriented to person, place, and time. She appears well-developed and well-nourished. No distress.  HENT:  Head: Normocephalic and atraumatic.  Nose: Nose normal.  Mouth/Throat: Oropharynx is clear and moist. No oropharyngeal exudate.  Eyes: EOM are normal.  Neck: Normal range of motion. Neck supple.  Cardiovascular: Normal rate, regular rhythm, normal heart sounds and intact distal pulses.   Pulmonary/Chest: Breath sounds normal. No respiratory distress.  Abdominal: Soft. Bowel sounds are normal. She exhibits no distension.  Mild tenderness across the left mid abdomen and the epigastrium. No rebound or guarding.  Musculoskeletal: She exhibits no edema.  Neurological: She is alert and oriented to person, place, and time.  Skin: Skin is warm and dry. No rash noted.  Psychiatric: She has a normal mood and affect.  Nursing note and vitals reviewed.   Urgent Care Course   Clinical Course     Procedures (including critical care time)  Labs Review Labs Reviewed - No data to display  Imaging Review No results found.   Visual Acuity Review  Right Eye Distance:   Left Eye Distance:   Bilateral  Distance:    Right Eye Near:   Left Eye Near:    Bilateral Near:         MDM   1. Nausea vomiting and diarrhea   2. Gastroenteritis    Take the Zofran for nausea and vomiting as directed. Drink clear liquids for the next 24 hours. Strongly recommend that you obtain at least one if not 2 quarts of Pedialyte to supplement the loss of electrolytes. This will help you to feel better sooner. Slowly advance diet as tolerated tomorrow  afternoon. If you are still vomiting after advancing her diet to more solid foods to back to clear liquids. Meds ordered this encounter  Medications  . ondansetron (ZOFRAN-ODT) disintegrating tablet 4 mg  . ondansetron (ZOFRAN) 4 MG tablet    Sig: Take 1 tablet (4 mg total) by mouth every 6 (six) hours. Prn N or V.    Dispense:  12 tablet    Refill:  0    Order Specific Question:   Supervising Provider    Answer:   Carmela Hurt       Janne Napoleon, NP 10/30/16 [phone]

## 2016-10-30 NOTE — ED Triage Notes (Signed)
Nausea   Vomiting         Diarrhea           Pt  Reports        Steady  Cramping  Pain  In  abd   Also  Reports  Some  Chills  As   Well

## 2016-10-30 NOTE — Discharge Instructions (Signed)
Take the Zofran for nausea and vomiting as directed. Drink clear liquids for the next 24 hours. Strongly recommend that you obtain at least one if not 2 quarts of Pedialyte to supplement the loss of electrolytes. This will help you to feel better sooner. Slowly advance diet as tolerated tomorrow afternoon. If you are still vomiting after advancing her diet to more solid foods to back to clear liquids.

## 2016-11-12 ENCOUNTER — Other Ambulatory Visit: Payer: Self-pay | Admitting: Obstetrics & Gynecology

## 2016-11-12 DIAGNOSIS — R102 Pelvic and perineal pain: Secondary | ICD-10-CM

## 2016-11-14 ENCOUNTER — Ambulatory Visit
Admission: RE | Admit: 2016-11-14 | Discharge: 2016-11-14 | Disposition: A | Discharge status: Home / Self Care | Payer: Managed Care, Other (non HMO) | Source: Ambulatory Visit | Attending: Obstetrics & Gynecology | Admitting: Obstetrics & Gynecology

## 2016-11-14 DIAGNOSIS — R102 Pelvic and perineal pain: Secondary | ICD-10-CM

## 2016-11-17 NOTE — L&D Delivery Note (Addendum)
Delivery Note- Shoulder dystocia At 9:27 AM a viable female was delivered via Vaginal, Spontaneous Delivery (Presentation: LOA  ).  APGAR: 8, 9; weight 8 lb 11.2 oz (3945 g).  Delivery was compliated by shoulder dystocia.  [employer] performed, posterior arm (left) was grasped and delivered.  Dystocia was ~30sec. Placenta status: to L&D. Cord:  with the following complications: n/a .  Cord pH: n/a  Anesthesia:  none Episiotomy: None Lacerations: None Suture Repair: n/a Est. Blood Loss (mL): 300  Mom to postpartum.  Baby to Couplet care / Skin to Skin.  Janyth Pupa, M 09/02/2017, 11:17 AM

## 2016-11-24 NOTE — Patient Instructions (Signed)
Your procedure is scheduled on:  Tomorrow, Jan. 10, 2018  Enter through the [employer] [facility] at:  9:30 AM  Pick up the phone at the desk and dial [phone].  Call this number if you have problems the morning of surgery: [phone].  Remember: Do NOT eat food or drink after:  Midnight Tonight  Take these medicines the morning of surgery with a SIP OF WATER:  None  Stop ALL herbal medications at this time  Do NOT smoke the day of surgery.  Do NOT wear jewelry (body piercing), metal hair clips/bobby pins, make-up, or nail polish. Do NOT wear lotions, powders, or perfumes.  You may wear deodorant. Do NOT shave for 48 hours prior to surgery. Do NOT bring valuables to the hospital. Contacts, dentures, or bridgework may not be worn into surgery.  Have a responsible adult drive you home and stay with you for 24 hours after your procedure  Bring a copy of your healthcare power of attorney and living will documents.

## 2016-11-24 NOTE — H&P (Signed)
31yo G1P1001 who presents for surgical management of large adnexal mass.  In review, she reports that for the past 1-2 mos she has noted intermittent pelvic pain. Pt has gotten gradually worse over this time frame. If she has the pain it will occur all day and usually happens about 3-4 days out of the week. Pain is both achy and sharp and will come and go throughout the day on her right side. Pain is non-radiating 7/10. Not sure what makes it feel better or worse. Does take Motrin occasionally with some improvement. Mild nausea, denies vomiting or fevers.       ROS:  CONSTITUTIONAL:  no Chills. no Fever. Night sweats yes.  HEENT:  Blurrred vision no. no Double vision.  CARDIOLOGY:  no Chest pain.  RESPIRATORY:  no Shortness of breath. no Cough.  UROLOGY:  no Urinary frequency. no Urinary incontinence. no Urinary urgency.  GASTROENTEROLOGY:  Abdominal pain yes. no Appetite change. Bloating/belching yes. no Change in bowel movements. Nausea yes.  FEMALE REPRODUCTIVE:  no Breast lumps or discharge. no Breast pain.  NEUROLOGY:  no Dizziness. no Headache. no Loss of consciousness.  PSYCHOLOGY:  Anxiety yes. no Depression.  SKIN:  no Rash. no Hives.  HEMATOLOGY/LYMPH:  no Anemia. no Fatigue. Using Blood Thinners no.         Medical History: none        Gyn History:  Sexual activity currently sexually active.  Periods : every month, but at different times throughout the month.  LMP 11/07/2016 but lasted 2 days, normally last 5 days .  Birth control none.  Last pap smear date 2017 at [place] .  Abnormal pap smear assessed with colposcopy.  STD HPV.        OB History:  Pregnancy # 1 2009, live birth, vaginal delivery, girl-No complications.        Surgical History: Gallbladder 2010.        Family History: Father: alive, diagnosed with DM. Mother: alive, diagnosed with DM. Paternal [place] Father: alive, some type of thyroid problem. Paternal Grand  Mother: alive. Maternal Grand Father: alive. Maternal Grand Mother: alive, uterine fibroids.  denies any GYN family cancer hx.       Social History:  General:  Tobacco use  cigarettes: Current smoker Frequency: 1/2 PPD Tobacco history last updated 11/20/2016 no Alcohol.  no Recreational drug use.  OCCUPATION: [employer].        Medications: None       Allergies: N.K.D.A.   O: Performed in office  Vitals: Wt 232.5, Ht 70, BMI 33.36, Pulse sitting 71, BP sitting 134/69.       Examination:  General Examination:  GENERAL APPEARANCE well developed, well nourished .  SKIN: warm and dry, no rashes .  NECK: supple, normal appearance .  LUNGS: regular breathing rate and effort .  HEART: no murmurs, regular rate and rhythm .  ABDOMEN: soft and not tender, no masses palpated, no rebound, no rigidity, reports RLQ tenderness- no reproducible pain .  FEMALE GENITOURINARY: normal external genitalia, labia - unremarkable, vagina - pink moist mucosa, no lesions or abnormal discharge, cervix - no discharge or lesions or CMT, adnexa - +right adnexal fullness, non-tender, mobile mass appreciated, uterus - nontender and normal size on palpation.  MUSCULOSKELETAL no calf tenderness bilaterally .  EXTREMITIES: no edema present .  PSYCH: appropriate mood and affect .      TVUS: [place] report: 11.8x7.4x6.2cm right adnexal mas with solid mural component (4.5cm), +  internal septations, +internal echos. ROMA: low risk  A/P: 6 G1P1 who presents for Operative laparoscopy- left cystectomy, possible oophorectomy. -NPO -LR @ 125cc/hr -SCDs to OR -Discussed risk of benign vs malignancy. ROMA completed- low risk (0.49) Discussed ovarian preservation if possible and discussed potential need for further surgery if malignancy noted. Reviewed risk of surgery including but not limited to risk of bleeding, infection and injury. Questions and concerns were addressed and pt wishes to proceed.  Janyth Pupa,  DO [phone] (pager) [phone] (office)

## 2016-11-25 ENCOUNTER — Encounter (HOSPITAL_COMMUNITY): Payer: Self-pay

## 2016-11-25 ENCOUNTER — Encounter (HOSPITAL_COMMUNITY)
Admission: RE | Admit: 2016-11-25 | Discharge: 2016-11-25 | Disposition: A | Discharge status: Home / Self Care | Payer: Managed Care, Other (non HMO) | Source: Ambulatory Visit | Attending: Obstetrics & Gynecology | Admitting: Obstetrics & Gynecology

## 2016-11-25 DIAGNOSIS — D649 Anemia, unspecified: Secondary | ICD-10-CM | POA: Diagnosis not present

## 2016-11-25 DIAGNOSIS — Z6834 Body mass index (BMI) 34.0-34.9, adult: Secondary | ICD-10-CM | POA: Diagnosis not present

## 2016-11-25 DIAGNOSIS — D271 Benign neoplasm of left ovary: Secondary | ICD-10-CM | POA: Diagnosis not present

## 2016-11-25 DIAGNOSIS — N736 Female pelvic peritoneal adhesions (postinfective): Secondary | ICD-10-CM | POA: Diagnosis not present

## 2016-11-25 DIAGNOSIS — E669 Obesity, unspecified: Secondary | ICD-10-CM | POA: Diagnosis not present

## 2016-11-25 DIAGNOSIS — N83202 Unspecified ovarian cyst, left side: Secondary | ICD-10-CM | POA: Diagnosis present

## 2016-11-25 DIAGNOSIS — F17210 Nicotine dependence, cigarettes, uncomplicated: Secondary | ICD-10-CM | POA: Diagnosis not present

## 2016-11-25 LAB — CBC
HCT: 34.7 % — ABNORMAL LOW (ref 36.0–46.0)
Hemoglobin: 11.5 g/dL — ABNORMAL LOW (ref 12.0–15.0)
MCH: 29.9 pg (ref 26.0–34.0)
MCHC: 33.1 g/dL (ref 30.0–36.0)
MCV: 90.1 fL (ref 78.0–100.0)
PLATELETS: 244 10*3/uL (ref 150–400)
RBC: 3.85 MIL/uL — AB (ref 3.87–5.11)
RDW: 13.7 % (ref 11.5–15.5)
WBC: 6.3 10*3/uL (ref 4.0–10.5)

## 2016-11-25 LAB — BASIC METABOLIC PANEL
Anion gap: 7 (ref 5–15)
BUN: 16 mg/dL (ref 6–20)
CO2: 24 mmol/L (ref 22–32)
Calcium: 9.2 mg/dL (ref 8.9–10.3)
Chloride: 105 mmol/L (ref 101–111)
Creatinine, Ser: 0.75 mg/dL (ref 0.44–1.00)
GFR calc non Af Amer: >60 mL/min (ref >60)
Glucose, Bld: 74 mg/dL (ref 65–99)
Potassium: 4.4 mmol/L (ref 3.5–5.1)
SODIUM: 136 mmol/L (ref 135–145)

## 2016-11-25 LAB — TYPE AND SCREEN
ABO/RH(D): A POS
Antibody Screen: NEGATIVE

## 2016-11-25 LAB — ABO/RH: ABO/RH(D): A POS

## 2016-11-26 ENCOUNTER — Encounter (HOSPITAL_COMMUNITY): Payer: Self-pay | Admitting: Anesthesiology

## 2016-11-26 ENCOUNTER — Ambulatory Visit (HOSPITAL_COMMUNITY): Payer: Managed Care, Other (non HMO) | Admitting: Anesthesiology

## 2016-11-26 ENCOUNTER — Ambulatory Visit (HOSPITAL_COMMUNITY)
Admission: RE | Admit: 2016-11-26 | Discharge: 2016-11-26 | Disposition: A | Discharge status: Home / Self Care | Payer: Managed Care, Other (non HMO) | Source: Ambulatory Visit | Attending: Obstetrics & Gynecology | Admitting: Obstetrics & Gynecology

## 2016-11-26 ENCOUNTER — Encounter (HOSPITAL_COMMUNITY)
Admission: RE | Disposition: A | Discharge status: Home / Self Care | Payer: Self-pay | Source: Ambulatory Visit | Attending: Obstetrics & Gynecology

## 2016-11-26 DIAGNOSIS — D271 Benign neoplasm of left ovary: Secondary | ICD-10-CM | POA: Insufficient documentation

## 2016-11-26 DIAGNOSIS — F17210 Nicotine dependence, cigarettes, uncomplicated: Secondary | ICD-10-CM | POA: Insufficient documentation

## 2016-11-26 DIAGNOSIS — Z6834 Body mass index (BMI) 34.0-34.9, adult: Secondary | ICD-10-CM | POA: Insufficient documentation

## 2016-11-26 DIAGNOSIS — E669 Obesity, unspecified: Secondary | ICD-10-CM | POA: Insufficient documentation

## 2016-11-26 DIAGNOSIS — N736 Female pelvic peritoneal adhesions (postinfective): Secondary | ICD-10-CM | POA: Insufficient documentation

## 2016-11-26 DIAGNOSIS — D649 Anemia, unspecified: Secondary | ICD-10-CM | POA: Insufficient documentation

## 2016-11-26 HISTORY — PX: LAPAROSCOPIC OVARIAN CYSTECTOMY: SHX6248

## 2016-11-26 LAB — PREGNANCY, URINE: PREG TEST UR: NEGATIVE

## 2016-11-26 SURGERY — EXCISION, CYST, OVARY, LAPAROSCOPIC
Anesthesia: General | Site: Abdomen | Laterality: Left

## 2016-11-26 MED ORDER — HEPARIN SODIUM (PORCINE) 5000 UNIT/ML IJ SOLN
INTRAMUSCULAR | Status: AC
  Filled 2016-11-26: qty 1

## 2016-11-26 MED ORDER — LACTATED RINGERS IV SOLN
INTRAVENOUS | Status: DC
  Administered 2016-11-26: 11:00:00 via INTRAVENOUS
  Administered 2016-11-26: 1000 mL via INTRAVENOUS

## 2016-11-26 MED ORDER — IBUPROFEN 600 MG PO TABS: 600.0000 mg | ORAL_TABLET | Freq: Four times a day (QID) | ORAL | 0 refills | Status: DC | PRN

## 2016-11-26 MED ORDER — KETOROLAC TROMETHAMINE 30 MG/ML IJ SOLN
INTRAMUSCULAR | Status: DC | PRN
  Administered 2016-11-26: 30 mg via INTRAVENOUS

## 2016-11-26 MED ORDER — PROMETHAZINE HCL 25 MG/ML IJ SOLN: 6.2500 mg | INTRAMUSCULAR | Status: DC | PRN

## 2016-11-26 MED ORDER — SODIUM CHLORIDE 0.9 % IJ SOLN
INTRAMUSCULAR | Status: DC | PRN
  Administered 2016-11-26: 10 mL

## 2016-11-26 MED ORDER — KETOROLAC TROMETHAMINE 30 MG/ML IJ SOLN
INTRAMUSCULAR | Status: AC
  Filled 2016-11-26: qty 1

## 2016-11-26 MED ORDER — GLYCOPYRROLATE 0.2 MG/ML IJ SOLN
INTRAMUSCULAR | Status: DC | PRN
  Administered 2016-11-26: 0.6 mg via INTRAVENOUS
  Administered 2016-11-26: 0.1 mg via INTRAVENOUS

## 2016-11-26 MED ORDER — DOCUSATE SODIUM 100 MG PO CAPS: 100.0000 mg | ORAL_CAPSULE | Freq: Two times a day (BID) | ORAL | 0 refills | Status: DC

## 2016-11-26 MED ORDER — SODIUM CHLORIDE 0.9 % IJ SOLN
INTRAMUSCULAR | Status: AC
  Filled 2016-11-26: qty 10

## 2016-11-26 MED ORDER — BUPIVACAINE HCL (PF) 0.25 % IJ SOLN
INTRAMUSCULAR | Status: DC | PRN
  Administered 2016-11-26: 30 mL

## 2016-11-26 MED ORDER — ONDANSETRON HCL 4 MG/2ML IJ SOLN
INTRAMUSCULAR | Status: AC
  Filled 2016-11-26: qty 2

## 2016-11-26 MED ORDER — FENTANYL CITRATE (PF) 250 MCG/5ML IJ SOLN
INTRAMUSCULAR | Status: AC
  Filled 2016-11-26: qty 5

## 2016-11-26 MED ORDER — PROPOFOL 10 MG/ML IV BOLUS
INTRAVENOUS | Status: DC | PRN
  Administered 2016-11-26: 30 mg via INTRAVENOUS
  Administered 2016-11-26: 25 mg via INTRAVENOUS
  Administered 2016-11-26: 170 mg via INTRAVENOUS

## 2016-11-26 MED ORDER — ROCURONIUM BROMIDE 100 MG/10ML IV SOLN
INTRAVENOUS | Status: AC
  Filled 2016-11-26: qty 1

## 2016-11-26 MED ORDER — PROPOFOL 10 MG/ML IV BOLUS
INTRAVENOUS | Status: AC
  Filled 2016-11-26: qty 20

## 2016-11-26 MED ORDER — BUPIVACAINE HCL (PF) 0.25 % IJ SOLN
INTRAMUSCULAR | Status: AC
  Filled 2016-11-26: qty 30

## 2016-11-26 MED ORDER — SCOPOLAMINE 1 MG/3DAYS TD PT72
MEDICATED_PATCH | TRANSDERMAL | Status: AC
  Filled 2016-11-26: qty 1

## 2016-11-26 MED ORDER — ROCURONIUM BROMIDE 100 MG/10ML IV SOLN
INTRAVENOUS | Status: DC | PRN
  Administered 2016-11-26: 40 mg via INTRAVENOUS
  Administered 2016-11-26 (×2): 10 mg via INTRAVENOUS

## 2016-11-26 MED ORDER — MIDAZOLAM HCL 2 MG/2ML IJ SOLN
INTRAMUSCULAR | Status: DC | PRN
  Administered 2016-11-26: 2 mg via INTRAVENOUS

## 2016-11-26 MED ORDER — LACTATED RINGERS IR SOLN
Status: DC | PRN
  Administered 2016-11-26: 3000 mL

## 2016-11-26 MED ORDER — OXYCODONE-ACETAMINOPHEN 5-325 MG PO TABS
1.0000 | ORAL_TABLET | Freq: Once | ORAL | Status: AC
Start: 2016-11-26 — End: 2016-11-26
  Administered 2016-11-26: 1 via ORAL

## 2016-11-26 MED ORDER — FENTANYL CITRATE (PF) 100 MCG/2ML IJ SOLN
INTRAMUSCULAR | Status: AC
  Filled 2016-11-26: qty 2

## 2016-11-26 MED ORDER — FENTANYL CITRATE (PF) 100 MCG/2ML IJ SOLN
INTRAMUSCULAR | Status: AC
  Administered 2016-11-26: 25 ug via INTRAVENOUS
  Filled 2016-11-26: qty 2

## 2016-11-26 MED ORDER — DEXAMETHASONE SODIUM PHOSPHATE 4 MG/ML IJ SOLN
INTRAMUSCULAR | Status: DC | PRN
  Administered 2016-11-26: 10 mg via INTRAVENOUS

## 2016-11-26 MED ORDER — FENTANYL CITRATE (PF) 100 MCG/2ML IJ SOLN
25.0000 ug | INTRAMUSCULAR | Status: DC | PRN
  Administered 2016-11-26 (×4): 25 ug via INTRAVENOUS

## 2016-11-26 MED ORDER — MIDAZOLAM HCL 2 MG/2ML IJ SOLN
INTRAMUSCULAR | Status: AC
  Filled 2016-11-26: qty 2

## 2016-11-26 MED ORDER — ONDANSETRON HCL 4 MG/2ML IJ SOLN
INTRAMUSCULAR | Status: DC | PRN
  Administered 2016-11-26: 4 mg via INTRAVENOUS

## 2016-11-26 MED ORDER — DEXAMETHASONE SODIUM PHOSPHATE 10 MG/ML IJ SOLN
INTRAMUSCULAR | Status: AC
  Filled 2016-11-26: qty 1

## 2016-11-26 MED ORDER — NEOSTIGMINE METHYLSULFATE 10 MG/10ML IV SOLN
INTRAVENOUS | Status: AC
  Filled 2016-11-26: qty 1

## 2016-11-26 MED ORDER — LIDOCAINE HCL (CARDIAC) 20 MG/ML IV SOLN
INTRAVENOUS | Status: AC
  Filled 2016-11-26: qty 5

## 2016-11-26 MED ORDER — NEOSTIGMINE METHYLSULFATE 10 MG/10ML IV SOLN
INTRAVENOUS | Status: DC | PRN
  Administered 2016-11-26: 3 mg via INTRAVENOUS

## 2016-11-26 MED ORDER — LIDOCAINE HCL (CARDIAC) 20 MG/ML IV SOLN
INTRAVENOUS | Status: DC | PRN
  Administered 2016-11-26: 100 mg via INTRAVENOUS

## 2016-11-26 MED ORDER — OXYCODONE-ACETAMINOPHEN 5-325 MG PO TABS
ORAL_TABLET | ORAL | Status: AC
  Filled 2016-11-26: qty 1

## 2016-11-26 MED ORDER — SCOPOLAMINE 1 MG/3DAYS TD PT72
1.0000 | MEDICATED_PATCH | TRANSDERMAL | Status: DC
  Administered 2016-11-26: 1.5 mg via TRANSDERMAL

## 2016-11-26 MED ORDER — FENTANYL CITRATE (PF) 100 MCG/2ML IJ SOLN
INTRAMUSCULAR | Status: DC | PRN
  Administered 2016-11-26 (×2): 50 ug via INTRAVENOUS
  Administered 2016-11-26: 100 ug via INTRAVENOUS
  Administered 2016-11-26 (×3): 50 ug via INTRAVENOUS

## 2016-11-26 MED ORDER — OXYCODONE-ACETAMINOPHEN 5-325 MG PO TABS: 1.0000 | ORAL_TABLET | Freq: Four times a day (QID) | ORAL | 0 refills | Status: DC | PRN

## 2016-11-26 SURGICAL SUPPLY — 36 items
APPLICATOR ARISTA FLEXITIP XL (MISCELLANEOUS) ×3 IMPLANT
CLOTH BEACON ORANGE TIMEOUT ST (SAFETY) ×3 IMPLANT
DERMABOND ADVANCED (GAUZE/BANDAGES/DRESSINGS) ×2
DERMABOND ADVANCED .7 DNX12 (GAUZE/BANDAGES/DRESSINGS) ×1 IMPLANT
DRSG OPSITE POSTOP 3X4 (GAUZE/BANDAGES/DRESSINGS) ×3 IMPLANT
DURAPREP 26ML APPLICATOR (WOUND CARE) ×3 IMPLANT
FORCEPS CUTTING 33CM 5MM (CUTTING FORCEPS) IMPLANT
GLOVE BIOGEL PI IND STRL 6.5 (GLOVE) ×2 IMPLANT
GLOVE BIOGEL PI IND STRL 7.0 (GLOVE) ×1 IMPLANT
GLOVE BIOGEL PI INDICATOR 6.5 (GLOVE) ×4
GLOVE BIOGEL PI INDICATOR 7.0 (GLOVE) ×2
GLOVE ECLIPSE 6.5 STRL STRAW (GLOVE) ×3 IMPLANT
GOWN STRL REUS W/TWL LRG LVL3 (GOWN DISPOSABLE) ×6 IMPLANT
HEMOSTAT ARISTA ABSORB 3G PWDR (MISCELLANEOUS) ×3 IMPLANT
NEEDLE INSUFFLATION 120MM (ENDOMECHANICALS) ×3 IMPLANT
PACK LAPAROSCOPY BASIN (CUSTOM PROCEDURE TRAY) ×3 IMPLANT
PACK TRENDGUARD 450 HYBRID PRO (MISCELLANEOUS) ×1 IMPLANT
PACK TRENDGUARD 600 HYBRD PROC (MISCELLANEOUS) IMPLANT
PAD OB MATERNITY 4.3X12.25 (PERSONAL CARE ITEMS) ×3 IMPLANT
POUCH ENDO CATCH II 15MM (MISCELLANEOUS) ×3 IMPLANT
POUCH SPECIMEN RETRIEVAL 10MM (ENDOMECHANICALS) ×3 IMPLANT
PROTECTOR NERVE ULNAR (MISCELLANEOUS) ×6 IMPLANT
SET IRRIG TUBING LAPAROSCOPIC (IRRIGATION / IRRIGATOR) ×3 IMPLANT
SHEARS HARMONIC ACE PLUS 36CM (ENDOMECHANICALS) ×3 IMPLANT
SLEEVE XCEL OPT CAN 5 100 (ENDOMECHANICALS) ×6 IMPLANT
SUT MON AB 4-0 PS1 27 (SUTURE) ×3 IMPLANT
SUT VICRYL 0 UR6 27IN ABS (SUTURE) ×3 IMPLANT
TOWEL OR 17X24 6PK STRL BLUE (TOWEL DISPOSABLE) ×6 IMPLANT
TRAY FOLEY CATH SILVER 14FR (SET/KITS/TRAYS/PACK) ×3 IMPLANT
TRENDGUARD 450 HYBRID PRO PACK (MISCELLANEOUS) ×3
TRENDGUARD 600 HYBRID PROC PK (MISCELLANEOUS)
TROCAR BLADELESS 15MM (ENDOMECHANICALS) ×3 IMPLANT
TROCAR XCEL NON-BLD 11X100MML (ENDOMECHANICALS) ×3 IMPLANT
TROCAR XCEL NON-BLD 5MMX100MML (ENDOMECHANICALS) ×3 IMPLANT
WARMER LAPAROSCOPE (MISCELLANEOUS) ×3 IMPLANT
WATER STERILE IRR 1000ML POUR (IV SOLUTION) ×3 IMPLANT

## 2016-11-26 NOTE — Transfer of Care (Signed)
Immediate Anesthesia Transfer of Care Note  Patient: Toni Sanchez  Procedure(s) Performed: Procedure(s): LAPAROSCOPIC OVARIAN CYSTECTOMY (Left)  Patient Location: PACU  Anesthesia Type:General  Level of Consciousness: awake, alert  and oriented  Airway & Oxygen Therapy: Patient Spontanous Breathing and Patient connected to nasal cannula oxygen  Post-op Assessment: Report given to RN and Post -op Vital signs reviewed and stable  Post vital signs: Reviewed and stable  Last Vitals:  Vitals:   11/26/16 0957 11/26/16 1307  BP: 121/69   Pulse: 60   Resp: 16   Temp: 37.1 C 36.8 C    Last Pain:  Vitals:   11/26/16 0957  TempSrc: Oral      Patients Stated Pain Goal: 3 ([id number])  Complications: No apparent anesthesia complications

## 2016-11-26 NOTE — Discharge Instructions (Addendum)
HOME INSTRUCTIONS  Please note any unusual or excessive bleeding, pain, swelling. Mild dizziness or drowsiness are normal for about 24 hours after surgery.  May remove Scop patch on or before Nov 29, 2016.  May take Ibuprofen after 6:45 PM   Shower when comfortable  Restrictions: No driving for 24 hours or while taking pain medications.  Activity:  No heavy lifting (> 10 lbs), nothing in vagina (no tampons, douching, or intercourse) x 4 weeks; no tub baths for 4 weeks Vaginal spotting is expected but if your bleeding is heavy, period like,  please call the office   Incision: You may clean your incision with mild soap and water but do not rub or scrub the incision site.  You may experience slight bloody drainage from your incision periodically.  This is normal.  If you experience a large amount of drainage or the incision opens, please call your physician who will likely direct you to the emergency department.  Diet:  You may return to your regular diet.  Do not eat large meals.  Eat small frequent meals throughout the day.  Continue to drink a good amount of water at least 6-8 glasses of water per day, hydration is very important for the healing process.  Pain Management: Take Motrin and/or Percocet as prescribed/needed for pain.  Percocet may cause constipation so please be sure to take the stool softener (colace) twice daily as needed.  Always take prescription pain medication with food, it may cause constipation, increase fluids and fiber and you may want to take an over-the-counter stool softener like Colace as needed up to 2x a day.    Alcohol -- Avoid for 24 hours and while taking pain medications.  Nausea: Take sips of ginger ale or soda  Fever -- Call physician if temperature over 101 degrees  Follow up:  If you do not already have a follow up appointment scheduled, please call the office at [phone].  If you experience fever (a temperature greater than 100.4), pain  unrelieved by pain medication, shortness of breath, swelling of a single leg, or any other symptoms which are concerning to you please the office immediately.

## 2016-11-26 NOTE — Anesthesia Procedure Notes (Signed)
Procedure Name: Intubation Date/Time: 11/26/2016 11:01 AM Performed by: Flossie Dibble Pre-anesthesia Checklist: Patient identified, Emergency Drugs available, Suction available, Patient being monitored and Timeout performed Patient Re-evaluated:Patient Re-evaluated prior to inductionOxygen Delivery Method: Circle system utilized Preoxygenation: Pre-oxygenation with 100% oxygen Intubation Type: IV induction Ventilation: Oral airway inserted - appropriate to patient size and Mask ventilation without difficulty Laryngoscope Size: Mac and 3 (Recommend MAC 4.) Grade View: Grade I Tube type: Oral Tube size: 7.0 mm Number of attempts: 1 Airway Equipment and Method: Stylet Placement Confirmation: ETT inserted through vocal cords under direct vision,  positive ETCO2 and breath sounds checked- equal and bilateral Secured at: 22.5 cm Tube secured with: Tape Dental Injury: Teeth and Oropharynx as per pre-operative assessment

## 2016-11-26 NOTE — Anesthesia Preprocedure Evaluation (Signed)
Anesthesia Evaluation  Patient identified by MRN, date of birth, ID band Patient awake    Reviewed: Allergy & Precautions, NPO status , Patient's Chart, lab work & pertinent test results  Airway Mallampati: I  TM Distance: >3 FB Neck ROM: Full    Dental  (+) Teeth Intact, Dental Advisory Given   Pulmonary Current Smoker,    Pulmonary exam normal breath sounds clear to auscultation       Cardiovascular Exercise Tolerance: Good negative cardio ROS Normal cardiovascular exam Rhythm:Regular Rate:Normal     Neuro/Psych negative neurological ROS     GI/Hepatic negative GI ROS, Neg liver ROS,   Endo/Other  Obesity   Renal/GU negative Renal ROS     Musculoskeletal negative musculoskeletal ROS (+)   Abdominal   Peds  Hematology  (+) Blood dyscrasia, anemia ,   Anesthesia Other Findings Day of surgery medications reviewed with the patient.  Reproductive/Obstetrics negative OB ROS                             Anesthesia Physical Anesthesia Plan  ASA: II  Anesthesia Plan: General   Post-op Pain Management:    Induction: Intravenous  Airway Management Planned: Oral ETT  Additional Equipment:   Intra-op Plan:   Post-operative Plan: Extubation in OR  Informed Consent: I have reviewed the patients History and Physical, chart, labs and discussed the procedure including the risks, benefits and alternatives for the proposed anesthesia with the patient or authorized representative who has indicated his/her understanding and acceptance.   Dental advisory given  Plan Discussed with: CRNA  Anesthesia Plan Comments: (Risks/benefits of general anesthesia discussed with patient including risk of damage to teeth, lips, gum, and tongue, nausea/vomiting, allergic reactions to medications, and the possibility of heart attack, stroke and death.  All patient questions answered.  Patient wishes to  proceed.)        Anesthesia Quick Evaluation

## 2016-11-26 NOTE — Interval H&P Note (Signed)
History and Physical Interval Note:  11/26/2016 10:14 AM  Toni Sanchez  has presented today for surgery, with the diagnosis of N83.292 Complex Cyst of Left Ovary  The various methods of treatment have been discussed with the patient and family. After consideration of risks, benefits and other options for treatment, the patient has consented to  Procedure(s): LAPAROSCOPIC OVARIAN CYSTECTOMY (Left) possible oophorectomy as a surgical intervention .  The patient's history has been reviewed, patient examined, no change in status, stable for surgery.  I have reviewed the patient's chart and labs.  Questions were answered to the patient's satisfaction.     Janyth Pupa, M

## 2016-11-26 NOTE — Op Note (Signed)
PREOPERATIVE DIAGNOSIS:  1) Complex left adnexal cyst POSTOPERATIVE DIAGNOSIS: same and pelvic adhesions PROCEDURE PERFORMED: Lysis of adhesions, left oophorectomy SURGEON: Dr. Janyth Pupa ASSISTANT:Dr. Christophe Louis ANESTHESIA: General endotracheal.  ESTIMATED BLOOD LOSS: 25cc.  URINE OUTPUT: 100cc of clear yellow urine at the end of the procedure.  IV FLUIDS: 1500cc of crystalloid.  SPECIMEN(S): 1) pelvic washing 2) left ovary with ovarian cyst COMPLICATIONS: None.  CONDITION: Stable.  FINDINGS: No ascites or peritoneal studding was appreciated.  Liver and bowel appeared grossly normal.  Mild adhesions in RUQ near gallbladder.  Uterus normal size and shape.  Complex enlarged ~13cm left ovary with dense adhesions to the side wall.  Filmy adhesions between the ovary and the left fallopian tube and uterus.  Grossly normal appearing right ovary and tube.  Several clear to yellow blebs noted throughout the pelvis- mostly in the posterior cul-de-sac. Informed consent was obtained from the patient prior to taking her to the operating room where anesthesia was found to be adequate. She was placed in dorsal lithotomy position and examined under anesthesia. She was prepped and draped in normal sterile fashion. The bladder was catheterized with a foley under sterile technique.  A bi-valve speculum was then placed and the anterior lip of the cervix was grasped with the single tooth tenaculum. The [place] uterine manipulator was then advanced into the uterus to provide uterine mobility. The speculum and tenaculum were then removed.  Attention was then turned to the patient's abdomen. Quarter percent marcaine was used.  Then a 10 mm infraumbilical skin incision was made with the scalpel. The veress needle was carefully introduced into the peritoneal cavity while tenting the abdominal wall. Intraperitoneal placement was confirmed by use of a saline-drop test.  The gas was connected and confirmed intrabdominal  placement by a low initial pressure of 6 mmHg. The abdomen was then insuflated with CO2 gas. The trocar and sleeve were then advanced without difficulty into the abdomen under direct visualization. Intraabdominal placement was confirmed by the laparoscope and surveillance of the abdomen was performed.  Findings as described above. Two additional 37mm skin incision were made in the left and right lower quadrants with placement of the trocar under direct visualization.   Filmy adhesions between the left fallopian tube and ovary were taken down with Harmonic.  The utero-ovarian ligament was identified and ligated with the harmonic.  Careful examination and dissection was performed to separate the ovary from its adhesions using both sharp and blunt dissection.  The left ureter was identified.  The left infundipulopelvic ligament was clamped and ligated.  The Kleppinger was used to obtain adequate hemostasis.  Using both blunt and sharp dissection with the harmonic, the adhesions between the left ovary and pelvic side wall were taken down.  Once the left ovary was freed from all adhesions attention was turned to removal.  The supraumbilical incision was extended to a 9mm size as the 47mm bag was too small for the large cyst.  The endobag was then insert and the ovary was placed inside the bag.  The ovary was then brought to the skin within the bag.  Multiple cysts were drained within the bag some with clear fluid others with sebaceous material.  The ovary was then able to be removed from the field and sent to pathology.  The camera was re-inserted, the pelvis was copiously suctioned and irrigated.  Excellent hemostasis was noted.  Again the left ureter was identified with peristalsis.  Arista was placed throughout the pelvis.  The 17mm trocars were removed under direct visualization.  The larger 17mm port was removed and air was allowed to fully escape from the abdomen.  The fascia was closed using 0-vicryl in a  running fashion.  The supraumbilical port site was closed with monocryl.  The remaining sites were closed with dermabond. The manipulator was removed vaginally.  Hemostasis was achieved with silver nitrazine.  The foley was removed.  The patient tolerated the procedure well with all sponge, lap, and needle counts correct. The patient was taken to recovery in stable condition. Dr. Landry Mellow was present to assist due to the complexity of this case and since we do not have residents are our service.  Janyth Pupa, DO [phone] (pager) [phone] (office)

## 2016-11-26 NOTE — Anesthesia Postprocedure Evaluation (Signed)
Anesthesia Post Note  Patient: Toni Sanchez  Procedure(s) Performed: Procedure(s) (LRB): LAPAROSCOPIC OVARIAN CYSTECTOMY (Left)  Patient location during evaluation: PACU Anesthesia Type: General Level of consciousness: awake and alert Pain management: pain level controlled Vital Signs Assessment: post-procedure vital signs reviewed and stable Respiratory status: spontaneous breathing, nonlabored ventilation, respiratory function stable and patient connected to nasal cannula oxygen Cardiovascular status: blood pressure returned to baseline and stable Postop Assessment: no signs of nausea or vomiting Anesthetic complications: no        Last Vitals:  Vitals:   11/26/16 1530 11/26/16 1545  BP: 136/81 125/80  Pulse: 77 78  Resp: 16 16  Temp:  36.9 C    Last Pain:  Vitals:   11/26/16 1545  TempSrc:   PainSc: 6    Pain Goal: Patients Stated Pain Goal: 3 (11/26/16 1515)               Catalina Gravel

## 2016-11-27 ENCOUNTER — Encounter (HOSPITAL_COMMUNITY): Payer: Self-pay | Admitting: Obstetrics & Gynecology

## 2016-12-22 ENCOUNTER — Encounter: Payer: Managed Care, Other (non HMO) | Admitting: Obstetrics & Gynecology

## 2017-01-30 LAB — OB RESULTS CONSOLE HIV ANTIBODY (ROUTINE TESTING): HIV: NONREACTIVE

## 2017-01-30 LAB — OB RESULTS CONSOLE GC/CHLAMYDIA
CHLAMYDIA, DNA PROBE: NEGATIVE
GC PROBE AMP, GENITAL: NEGATIVE

## 2017-01-30 LAB — OB RESULTS CONSOLE RPR: RPR: NONREACTIVE

## 2017-01-30 LAB — OB RESULTS CONSOLE HEPATITIS B SURFACE ANTIGEN: Hepatitis B Surface Ag: NEGATIVE

## 2017-01-30 LAB — OB RESULTS CONSOLE RUBELLA ANTIBODY, IGM: RUBELLA: IMMUNE

## 2017-02-17 LAB — OB RESULTS CONSOLE GC/CHLAMYDIA
Chlamydia: NEGATIVE
Gonorrhea: NEGATIVE

## 2017-03-30 ENCOUNTER — Inpatient Hospital Stay (HOSPITAL_COMMUNITY)
Admission: AD | Admit: 2017-03-30 | Discharge: 2017-03-30 | Disposition: A | Discharge status: Home / Self Care | Payer: Medicaid Other | Source: Ambulatory Visit | Attending: Obstetrics & Gynecology | Admitting: Obstetrics & Gynecology

## 2017-03-30 ENCOUNTER — Other Ambulatory Visit: Payer: Self-pay | Admitting: Student

## 2017-03-30 ENCOUNTER — Encounter (HOSPITAL_COMMUNITY): Payer: Self-pay | Admitting: *Deleted

## 2017-03-30 ENCOUNTER — Other Ambulatory Visit (HOSPITAL_COMMUNITY): Payer: Self-pay | Admitting: Student

## 2017-03-30 DIAGNOSIS — Z87891 Personal history of nicotine dependence: Secondary | ICD-10-CM | POA: Diagnosis not present

## 2017-03-30 DIAGNOSIS — R51 Headache: Secondary | ICD-10-CM

## 2017-03-30 DIAGNOSIS — Z3A16 16 weeks gestation of pregnancy: Secondary | ICD-10-CM | POA: Insufficient documentation

## 2017-03-30 DIAGNOSIS — O26892 Other specified pregnancy related conditions, second trimester: Secondary | ICD-10-CM | POA: Insufficient documentation

## 2017-03-30 HISTORY — DX: Other specified health status: Z78.9

## 2017-03-30 LAB — URINALYSIS, ROUTINE W REFLEX MICROSCOPIC
BACTERIA UA: NONE SEEN
BILIRUBIN URINE: NEGATIVE
Glucose, UA: NEGATIVE mg/dL
Hgb urine dipstick: NEGATIVE
KETONES UR: NEGATIVE mg/dL
Nitrite: NEGATIVE
Protein, ur: NEGATIVE mg/dL
Specific Gravity, Urine: 1.02 (ref 1.005–1.030)
pH: 5 (ref 5.0–8.0)

## 2017-03-30 MED ORDER — BUTALBITAL-APAP-CAFFEINE 50-325-40 MG PO TABS: 1.0000 | ORAL_TABLET | Freq: Four times a day (QID) | ORAL | 2 refills | Status: DC | PRN

## 2017-03-30 MED ORDER — BUTALBITAL-APAP-CAFFEINE 50-325-40 MG PO TABS: 1.0000 | ORAL_TABLET | Freq: Four times a day (QID) | ORAL | 0 refills | Status: DC | PRN

## 2017-03-30 MED ORDER — BUTALBITAL-APAP-CAFFEINE 50-325-40 MG PO TABS: 2.0000 | ORAL_TABLET | Freq: Once | ORAL | Status: DC

## 2017-03-30 MED ORDER — BUTALBITAL-APAP-CAFFEINE 50-325-40 MG PO TABS: 2.0000 | ORAL_TABLET | Freq: Once | ORAL | 2 refills | Status: AC

## 2017-03-30 MED ORDER — BUTALBITAL-APAP-CAFFEINE 50-325-40 MG PO TABS
2.0000 | ORAL_TABLET | Freq: Once | ORAL | Status: AC
  Administered 2017-03-30: 2 via ORAL
  Filled 2017-03-30: qty 2

## 2017-03-30 NOTE — Discharge Instructions (Signed)
Dehydration, Adult Dehydration is when there is not enough fluid or water in your body. This happens when you lose more fluids than you take in. Dehydration can range from mild to very bad. It should be treated right away to keep it from getting very bad. Symptoms of mild dehydration may include:   Thirst.  Dry lips.  Slightly dry mouth.  Dry, warm skin.  Dizziness. Symptoms of moderate dehydration may include:   Very dry mouth.  Muscle cramps.  Dark pee (urine). Pee may be the color of tea.  Your body making less pee.  Your eyes making fewer tears.  Heartbeat that is uneven or faster than normal (palpitations).  Headache.  Light-headedness, especially when you stand up from sitting.  Fainting (syncope). Symptoms of very bad dehydration may include:   Changes in skin, such as:  Cold and clammy skin.  Blotchy (mottled) or pale skin.  Skin that does not quickly return to normal after being lightly pinched and let go (poor skin turgor).  Changes in body fluids, such as:  Feeling very thirsty.  Your eyes making fewer tears.  Not sweating when body temperature is high, such as in hot weather.  Your body making very little pee.  Changes in vital signs, such as:  Weak pulse.  Pulse that is more than 100 beats a minute when you are sitting still.  Fast breathing.  Low blood pressure.  Other changes, such as:  Sunken eyes.  Cold hands and feet.  Confusion.  Lack of energy (lethargy).  Trouble waking up from sleep.  Short-term weight loss.  Unconsciousness. Follow these instructions at home:  If told by your doctor, drink an ORS:  Make an ORS by using instructions on the package.  Start by drinking small amounts, about  cup (120 mL) every 5-10 minutes.  Slowly drink more until you have had the amount that your doctor said to have.  Drink enough clear fluid to keep your pee clear or pale yellow. If you were told to drink an ORS, finish the  ORS first, then start slowly drinking clear fluids. Drink fluids such as:  Water. Do not drink only water by itself. Doing that can make the salt (sodium) level in your body get too low (hyponatremia).  Ice chips.  Fruit juice that you have added water to (diluted).  Low-calorie sports drinks.  Avoid:  Alcohol.  Drinks that have a lot of sugar. These include high-calorie sports drinks, fruit juice that does not have water added, and soda.  Caffeine.  Foods that are greasy or have a lot of fat or sugar.  Take over-the-counter and prescription medicines only as told by your doctor.  Do not take salt tablets. Doing that can make the salt level in your body get too high (hypernatremia).  Eat foods that have minerals (electrolytes). Examples include bananas, oranges, potatoes, tomatoes, and spinach.  Keep all follow-up visits as told by your doctor. This is important. Contact a doctor if:  You have belly (abdominal) pain that:  Gets worse.  Stays in one area (localizes).  You have a rash.  You have a stiff neck.  You get angry or annoyed more easily than normal (irritability).  You are more sleepy than normal.  You have a harder time waking up than normal.  You feel:  Weak.  Dizzy.  Very thirsty.  You have peed (urinated) only a small amount of very dark pee during 6-8 hours. Get help right away if:  You   have symptoms of very bad dehydration.  You cannot drink fluids without throwing up (vomiting).  Your symptoms get worse with treatment.  You have a fever.  You have a very bad headache.  You are throwing up or having watery poop (diarrhea) and it:  Gets worse.  Does not go away.  You have blood or something green (bile) in your throw-up.  You have blood in your poop (stool). This may cause poop to look black and tarry.  You have not peed in 6-8 hours.  You pass out (faint).  Your heart rate when you are sitting still is more than 100 beats a  minute.  You have trouble breathing. This information is not intended to replace advice given to you by your health care provider. Make sure you discuss any questions you have with your health care provider. Document Released: 08/30/2009 Document Revised: 05/23/2016 Document Reviewed: 12/28/2015 Elsevier Interactive Patient Education  2017 [employer]  

## 2017-03-30 NOTE — MAU Note (Signed)
Patient c/o "severe headache" since Thursday. Has tried tylenol and ibuprofen with no relief.

## 2017-03-30 NOTE — MAU Provider Note (Signed)
History   Patient Toni Sanchez is a 31 y.o. G2P1001 at [redacted]w[redacted]d here with complaints of headache that has been unrelieved by tylenol and ibuprofen. She has had this HA for 4 days. She denies bleeding, discharge, pain with urination, flank pain, abdominal pain.    CSN: [id number]  Arrival date and time: 03/30/17 5784   First Provider Initiated Contact with Patient 03/30/17 [phone]      Chief Complaint  Patient presents with  . Headache   Headache   This is a new problem. The current episode started in the past 7 days (She says that the pain gets worse at night). The problem occurs constantly. The problem has been unchanged. The pain is located in the right unilateral region. The pain does not radiate. The pain quality is not similar to prior headaches. The quality of the pain is described as aching. The pain is at a severity of 8/10. Pertinent negatives include no nausea, neck pain, numbness or photophobia. Nothing aggravates the symptoms. She has tried nothing for the symptoms.    OB History    Gravida Para Term Preterm AB Living   2 1 1     1    SAB TAB Ectopic Multiple Live Births           1      Past Medical History:  Diagnosis Date  . Medical history non-contributory     Past Surgical History:  Procedure Laterality Date  . CHOLECYSTECTOMY  2010  . LAPAROSCOPIC OVARIAN CYSTECTOMY Left 11/26/2016   Procedure: LAPAROSCOPIC OVARIAN CYSTECTOMY;  Surgeon: Janyth Pupa, DO;  Location: [facility];  Service: Gynecology;  Laterality: Left;    Family History  Problem Relation Age of Onset  . Hypertension Mother   . Diabetes Mother   . Hypertension Father     Social History  Substance Use Topics  . Smoking status: Former Smoker    Packs/day: 0.50    Years: 2.00    Types: Cigarettes    Quit date: 11/30/2016  . Smokeless tobacco: Never Used  . Alcohol use No    Allergies: No Known Allergies  Prescriptions Prior to Admission  Medication Sig Dispense Refill Last Dose  .  acetaminophen (TYLENOL) 325 MG tablet Take 650 mg by mouth every 6 (six) hours as needed for mild pain or headache.   03/29/2017 at Unknown time  . ibuprofen (ADVIL,MOTRIN) 600 MG tablet Take 1 tablet (600 mg total) by mouth every 6 (six) hours as needed. (Patient taking differently: Take 600 mg by mouth every 6 (six) hours as needed for headache. ) 30 tablet 0 03/29/2017 at Unknown time  . Prenatal Vit-Fe Fumarate-FA (PRENATAL MULTIVITAMIN) TABS tablet Take 1 tablet by mouth daily at 12 noon.   03/29/2017 at Unknown time  . docusate sodium (COLACE) 100 MG capsule Take 1 capsule (100 mg total) by mouth 2 (two) times daily. (Patient not taking: Reported on 03/30/2017) 30 capsule 0 Completed Course at Unknown time    Review of Systems  Eyes: Negative for photophobia.  Respiratory: Negative.   Cardiovascular: Negative.   Gastrointestinal: Negative for nausea.  Genitourinary: Negative.   Musculoskeletal: Negative for neck pain.  Neurological: Positive for headaches. Negative for numbness.   Physical Exam   Blood pressure 138/71, pulse 83, temperature 98.3 F (36.8 C), temperature source Oral, resp. rate 18, height 5\' 9"  (1.753 m), weight 110.2 kg (243 lb), last menstrual period 11/10/2016, SpO2 100 %.  Physical Exam  Constitutional: She is oriented to person, place, and  time. She appears well-developed.  HENT:  Head: Normocephalic.  Eyes: Pupils are equal, round, and reactive to light.  Neck: Normal range of motion.  GI: Soft. She exhibits no distension and no mass. There is no tenderness. There is no rebound and no guarding.  Musculoskeletal: Normal range of motion.  Neurological: She is alert and oriented to person, place, and time. She has normal reflexes. No cranial nerve deficit. She exhibits normal muscle tone. Coordination normal.  Skin: Skin is warm and dry.  Psychiatric: She has a normal mood and affect.    MAU Course  Procedures  MDM 2 fioricet for pain Patient's pain now from  an 8 to a 0; positive [facility] in triage Assessment and Plan   1. Headache in pregnancy, antepartum, second trimester    2. Patient relieved after two fioricet; patient stable for discharge. Discussed patient's presentation, pain and vital signs with Dr. Charlesetta Garibaldi, who agrees with plan of care.  3. Patient to be discharged with RX for fioricet and instrcutions to call her provider if she continues to have headaches.  4. Reviewed when to return to the MAU (bleeding, pain, or other concerning symptoms).     Mervyn Skeeters Kooistra CNM 03/30/2017, 10:24 AM

## 2017-06-02 LAB — OB RESULTS CONSOLE RPR: RPR: NONREACTIVE

## 2017-08-08 ENCOUNTER — Inpatient Hospital Stay (HOSPITAL_COMMUNITY)
Admission: AD | Admit: 2017-08-08 | Discharge: 2017-08-08 | Disposition: A | Discharge status: Home / Self Care | Payer: Medicaid Other | Source: Ambulatory Visit | Attending: Obstetrics & Gynecology | Admitting: Obstetrics & Gynecology

## 2017-08-08 ENCOUNTER — Encounter (HOSPITAL_COMMUNITY): Payer: Self-pay | Admitting: *Deleted

## 2017-08-08 DIAGNOSIS — D649 Anemia, unspecified: Secondary | ICD-10-CM | POA: Insufficient documentation

## 2017-08-08 DIAGNOSIS — Z3A35 35 weeks gestation of pregnancy: Secondary | ICD-10-CM | POA: Diagnosis not present

## 2017-08-08 DIAGNOSIS — O2243 Hemorrhoids in pregnancy, third trimester: Secondary | ICD-10-CM | POA: Insufficient documentation

## 2017-08-08 DIAGNOSIS — Z87891 Personal history of nicotine dependence: Secondary | ICD-10-CM | POA: Diagnosis not present

## 2017-08-08 DIAGNOSIS — O99013 Anemia complicating pregnancy, third trimester: Secondary | ICD-10-CM | POA: Diagnosis not present

## 2017-08-08 DIAGNOSIS — O468X3 Other antepartum hemorrhage, third trimester: Secondary | ICD-10-CM | POA: Diagnosis present

## 2017-08-08 DIAGNOSIS — K644 Residual hemorrhoidal skin tags: Secondary | ICD-10-CM

## 2017-08-08 MED ORDER — HYDROCORTISONE 2.5 % RE CREA: TOPICAL_CREAM | RECTAL | 1 refills | Status: AC

## 2017-08-08 MED ORDER — LIDOCAINE 5 % EX OINT: 1.0000 "application " | TOPICAL_OINTMENT | CUTANEOUS | 0 refills | Status: DC | PRN

## 2017-08-08 NOTE — MAU Provider Note (Signed)
History     CSN: [id number]  Arrival date and time: 08/08/17 1840   First Provider Initiated Contact with Patient 08/08/17 2038      Chief Complaint  Patient presents with  . Rectal Pain  . Rectal Bleeding   Toni Sanchez is a 31 y.o. G2P1001 at [redacted]w[redacted]d presenting with first episode of rectal bleeding from her known hemorrhoids. The bleeding was bright red and occurred following a bowel movement today. It seemed heavy at the time of BM, but not bleeding since. Stools are soft and she denies constipation or straining. She has Colace but has not needed it recently. She has used Tucks and Anusol with minimal relief. Her hemorrhoid pain is constant but and worse with sitting. Denies pruritis. Onset of hemorrhoids was 1-2 months ago. Good fetal movement. No vaginal bleeding or leakage of fluid. Pregnancy course essentially uncomplicated. Had mild anemia on iron supplementation earlier, last hgb 11.7.      OB History  Gravida Para Term Preterm AB Living  2 1 1     1   SAB TAB Ectopic Multiple Live Births          1    # Outcome Date GA Lbr Len/2nd Weight Sex Delivery Anes PTL Lv  2 Current           1 Term 12/28/07     Vag-Spont   LIV      Past Medical History:  Diagnosis Date  . Medical history non-contributory     Past Surgical History:  Procedure Laterality Date  . CHOLECYSTECTOMY  2010  . LAPAROSCOPIC OVARIAN CYSTECTOMY Left 11/26/2016   Procedure: LAPAROSCOPIC OVARIAN CYSTECTOMY;  Surgeon: Janyth Pupa, DO;  Location: [facility];  Service: Gynecology;  Laterality: Left;    Family History  Problem Relation Age of Onset  . Hypertension Mother   . Diabetes Mother   . Hypertension Father     Social History  Substance Use Topics  . Smoking status: Former Smoker    Packs/day: 0.50    Years: 2.00    Types: Cigarettes    Quit date: 11/30/2016  . Smokeless tobacco: Never Used  . Alcohol use No    Allergies: No Known Allergies  Prescriptions Prior to Admission   Medication Sig Dispense Refill Last Dose  . Prenatal Vit-Fe Fumarate-FA (PRENATAL MULTIVITAMIN) TABS tablet Take 1 tablet by mouth daily at 12 noon.   08/08/2017 at Unknown time  . acetaminophen (TYLENOL) 325 MG tablet Take 650 mg by mouth every 6 (six) hours as needed for mild pain or headache.   More than a month at Unknown time  . butalbital-acetaminophen-caffeine (FIORICET, ESGIC) 50-325-40 MG tablet Take 1-2 tablets by mouth every 6 (six) hours as needed for headache. 20 tablet 2 More than a month at Unknown time  . butalbital-acetaminophen-caffeine (FIORICET, ESGIC) 50-325-40 MG tablet Take 1-2 tablets by mouth every 6 (six) hours as needed for headache. 20 tablet 0   . docusate sodium (COLACE) 100 MG capsule Take 1 capsule (100 mg total) by mouth 2 (two) times daily. (Patient not taking: Reported on 03/30/2017) 30 capsule 0 More than a month at Unknown time    Review of Systems  Constitutional: Negative for fever.  Gastrointestinal: Positive for anal bleeding, blood in stool and rectal pain. Negative for abdominal pain, constipation and diarrhea.  Genitourinary: Negative for dysuria.  Musculoskeletal: Negative for back pain and gait problem.  Neurological: Negative for dizziness and light-headedness.  Psychiatric/Behavioral: The patient is not nervous/anxious.  Physical Exam   Blood pressure 108/63, pulse (!) 111, temperature 97.9 F (36.6 C), temperature source Oral, resp. rate 18, height 5\' 9"  (1.753 m), weight 289 lb 12 oz (131.4 kg), last menstrual period 11/10/2016, SpO2 100 %.  Physical Exam  Nursing note and vitals reviewed. Constitutional: She is oriented to person, place, and time. She appears well-developed and well-nourished. No distress.  HENT:  Head: Normocephalic.  Eyes: No scleral icterus.  Neck: Neck supple.  Cardiovascular: Normal rate.   Respiratory: Effort normal.  GI: Soft. There is no tenderness.  S=D  Genitourinary:  Genitourinary Comments: Rectal exam:  Cluster of soft fleshy external hemorrhoids largest 1.5cm, no blood seen. Digital: Tender, manually reduced. Scant red blood on exam finger   Musculoskeletal: Normal range of motion.  Neurological: She is alert and oriented to person, place, and time.  Skin: Skin is warm and dry.  Psychiatric: She has a normal mood and affect. Her behavior is normal.    MAU Course  Procedures   Fetal Monitoring Baseline fetal heart rate 145-150, moderate variability, accelerations present, no decelerations  Toco: Occasional mild UC  MDM C/W Dr. Charlesetta Garibaldi Will discharge home in stable condition with advised to keep her prenatal appointment in 2 days. Explained sitz bath, high fiber diet. Reassured that improvement expected after delivery.  Assessment and Plan  31 yo G2P101 at [redacted]w[redacted]d FWB by EFM 1. Bleeding external hemorrhoids    Allergies as of 08/08/2017   No Known Allergies     Medication List    STOP taking these medications   butalbital-acetaminophen-caffeine 50-325-40 MG tablet Commonly known as:  FIORICET, ESGIC     TAKE these medications   acetaminophen 325 MG tablet Commonly known as:  TYLENOL Take 650 mg by mouth every 6 (six) hours as needed for mild pain or headache.   docusate sodium 100 MG capsule Commonly known as:  COLACE Take 1 capsule (100 mg total) by mouth 2 (two) times daily.   hydrocortisone 2.5 % rectal cream Commonly known as:  ANUSOL-HC Apply rectally 2 times daily   lidocaine 5 % ointment Commonly known as:  XYLOCAINE Apply 1 application topically as needed.   prenatal multivitamin Tabs tablet Take 1 tablet by mouth daily at 12 noon.            Discharge Care Instructions        Start     Ordered   08/08/17 0000  hydrocortisone (ANUSOL-HC) 2.5 % rectal cream    Question:  Supervising Provider  Answer:  Verita Schneiders A   08/08/17 2041   08/08/17 0000  lidocaine (XYLOCAINE) 5 % ointment  As needed    Question:  Supervising Provider  Answer:   Verita Schneiders A   08/08/17 2041     Follow-up Information    Janyth Pupa, DO Follow up on 08/10/2017.   Specialty:  Obstetrics and Gynecology Why:  Keep your scheduled prenatal appointment Contact information: [address] [place] [address] [phone]           Candis Musa CNM 08/08/2017, 8:48 PM

## 2017-08-08 NOTE — Discharge Instructions (Signed)
Hemorrhoids Hemorrhoids are swollen veins in and around the rectum or anus. There are two types of hemorrhoids:  Internal hemorrhoids. These occur in the veins that are just inside the rectum. They may poke through to the outside and become irritated and painful.  External hemorrhoids. These occur in the veins that are outside of the anus and can be felt as a painful swelling or hard lump near the anus.  Most hemorrhoids do not cause serious problems, and they can be managed with home treatments such as diet and lifestyle changes. If home treatments do not help your symptoms, procedures can be done to shrink or remove the hemorrhoids. What are the causes? This condition is caused by increased pressure in the anal area. This pressure may result from various things, including:  Constipation.  Straining to have a bowel movement.  Diarrhea.  Pregnancy.  Obesity.  Sitting for long periods of time.  Heavy lifting or other activity that causes you to strain.  Anal sex.  What are the signs or symptoms? Symptoms of this condition include:  Pain.  Anal itching or irritation.  Rectal bleeding.  Leakage of stool (feces).  Anal swelling.  One or more lumps around the anus.  How is this diagnosed? This condition can often be diagnosed through a visual exam. Other exams or tests may also be done, such as:  Examination of the rectal area with a gloved hand (digital rectal exam).  Examination of the anal canal using a small tube (anoscope).  A blood test, if you have lost a significant amount of blood.  A test to look inside the colon (sigmoidoscopy or colonoscopy).  How is this treated? This condition can usually be treated at home. However, various procedures may be done if dietary changes, lifestyle changes, and other home treatments do not help your symptoms. These procedures can help make the hemorrhoids smaller or remove them completely. Some of these procedures involve  surgery, and others do not. Common procedures include:  Rubber band ligation. Rubber bands are placed at the base of the hemorrhoids to cut off the blood supply to them.  Sclerotherapy. Medicine is injected into the hemorrhoids to shrink them.  Infrared coagulation. A type of light energy is used to get rid of the hemorrhoids.  Hemorrhoidectomy surgery. The hemorrhoids are surgically removed, and the veins that supply them are tied off.  Stapled hemorrhoidopexy surgery. A circular stapling device is used to remove the hemorrhoids and use staples to cut off the blood supply to them.  Follow these instructions at home: Eating and drinking  Eat foods that have a lot of fiber in them, such as whole grains, beans, nuts, fruits, and vegetables. Ask your health care provider about taking products that have added fiber (fiber supplements).  Drink enough fluid to keep your urine clear or pale yellow. Managing pain and swelling  Take warm sitz baths for 20 minutes, 3-4 times a day to ease pain and discomfort.  If directed, apply ice to the affected area. Using ice packs between sitz baths may be helpful. ? Put ice in a plastic bag. ? Place a towel between your skin and the bag. ? Leave the ice on for 20 minutes, 2-3 times a day. General instructions  Take over-the-counter and prescription medicines only as told by your health care provider.  Use medicated creams or suppositories as told.  Exercise regularly.  Go to the bathroom when you have the urge to have a bowel movement. Do not wait.    Avoid straining to have bowel movements.  Keep the anal area dry and clean. Use wet toilet paper or moist towelettes after a bowel movement.  Do not sit on the toilet for long periods of time. This increases blood pooling and pain. Contact a health care provider if:  You have increasing pain and swelling that are not controlled by treatment or medicine.  You have uncontrolled bleeding.  You  have difficulty having a bowel movement, or you are unable to have a bowel movement.  You have pain or inflammation outside the area of the hemorrhoids. This information is not intended to replace advice given to you by your health care provider. Make sure you discuss any questions you have with your health care provider. Document Released: 10/31/2000 Document Revised: 04/02/2016 Document Reviewed: 07/18/2015 Elsevier Interactive Patient Education  2017 Elsevier Inc.  

## 2017-08-08 NOTE — MAU Note (Signed)
Pt states heavy bleeding in the rectum. Bleeding has been persisting for two days. Pt denies vaginal bleeding. Pt states abdominal pain.

## 2017-08-11 LAB — OB RESULTS CONSOLE GBS: GBS: NEGATIVE

## 2017-08-18 ENCOUNTER — Inpatient Hospital Stay (HOSPITAL_COMMUNITY)
Admission: AD | Admit: 2017-08-18 | Discharge: 2017-08-18 | Disposition: A | Discharge status: Home / Self Care | Payer: Medicaid Other | Source: Ambulatory Visit | Attending: Obstetrics & Gynecology | Admitting: Obstetrics & Gynecology

## 2017-08-18 ENCOUNTER — Encounter (HOSPITAL_COMMUNITY): Payer: Self-pay | Admitting: *Deleted

## 2017-08-18 DIAGNOSIS — O26893 Other specified pregnancy related conditions, third trimester: Secondary | ICD-10-CM | POA: Diagnosis not present

## 2017-08-18 DIAGNOSIS — O163 Unspecified maternal hypertension, third trimester: Secondary | ICD-10-CM

## 2017-08-18 DIAGNOSIS — Z79899 Other long term (current) drug therapy: Secondary | ICD-10-CM | POA: Insufficient documentation

## 2017-08-18 DIAGNOSIS — Z87891 Personal history of nicotine dependence: Secondary | ICD-10-CM | POA: Insufficient documentation

## 2017-08-18 DIAGNOSIS — Z9049 Acquired absence of other specified parts of digestive tract: Secondary | ICD-10-CM | POA: Insufficient documentation

## 2017-08-18 DIAGNOSIS — Z3A36 36 weeks gestation of pregnancy: Secondary | ICD-10-CM | POA: Diagnosis not present

## 2017-08-18 DIAGNOSIS — R51 Headache: Secondary | ICD-10-CM | POA: Insufficient documentation

## 2017-08-18 LAB — CBC WITH DIFFERENTIAL/PLATELET
Basophils Absolute: 0 10*3/uL (ref 0.0–0.1)
Basophils Relative: 0 %
EOS PCT: 1 %
Eosinophils Absolute: 0.1 10*3/uL (ref 0.0–0.7)
HCT: 36.2 % (ref 36.0–46.0)
Hemoglobin: 11.7 g/dL — ABNORMAL LOW (ref 12.0–15.0)
LYMPHS PCT: 27 %
Lymphs Abs: 1.8 10*3/uL (ref 0.7–4.0)
MCH: 29.7 pg (ref 26.0–34.0)
MCHC: 32.3 g/dL (ref 30.0–36.0)
MCV: 91.9 fL (ref 78.0–100.0)
MONO ABS: 0.3 10*3/uL (ref 0.1–1.0)
MONOS PCT: 4 %
Neutro Abs: 4.6 10*3/uL (ref 1.7–7.7)
Neutrophils Relative %: 68 %
PLATELETS: 236 10*3/uL (ref 150–400)
RBC: 3.94 MIL/uL (ref 3.87–5.11)
RDW: 13.9 % (ref 11.5–15.5)
WBC: 6.8 10*3/uL (ref 4.0–10.5)

## 2017-08-18 LAB — URINALYSIS, ROUTINE W REFLEX MICROSCOPIC
Bacteria, UA: NONE SEEN
Bilirubin Urine: NEGATIVE
GLUCOSE, UA: NEGATIVE mg/dL
HGB URINE DIPSTICK: NEGATIVE
Ketones, ur: NEGATIVE mg/dL
LEUKOCYTES UA: NEGATIVE
NITRITE: NEGATIVE
Protein, ur: 30 mg/dL — AB
SPECIFIC GRAVITY, URINE: 1.032 — AB (ref 1.005–1.030)
pH: 5 (ref 5.0–8.0)

## 2017-08-18 LAB — COMPREHENSIVE METABOLIC PANEL
ALT: 54 U/L (ref 14–54)
ANION GAP: 7 (ref 5–15)
AST: 31 U/L (ref 15–41)
Albumin: 2.9 g/dL — ABNORMAL LOW (ref 3.5–5.0)
Alkaline Phosphatase: 182 U/L — ABNORMAL HIGH (ref 38–126)
BUN: 8 mg/dL (ref 6–20)
CHLORIDE: 104 mmol/L (ref 101–111)
CO2: 22 mmol/L (ref 22–32)
CREATININE: 0.61 mg/dL (ref 0.44–1.00)
Calcium: 8.5 mg/dL — ABNORMAL LOW (ref 8.9–10.3)
Glucose, Bld: 92 mg/dL (ref 65–99)
Potassium: 3.9 mmol/L (ref 3.5–5.1)
Sodium: 133 mmol/L — ABNORMAL LOW (ref 135–145)
Total Bilirubin: 0.6 mg/dL (ref 0.3–1.2)
Total Protein: 6.4 g/dL — ABNORMAL LOW (ref 6.5–8.1)

## 2017-08-18 LAB — PROTEIN / CREATININE RATIO, URINE
CREATININE, URINE: 261 mg/dL
Protein Creatinine Ratio: 0.07 mg/mg{Cre} (ref 0.00–0.15)
TOTAL PROTEIN, URINE: 17 mg/dL

## 2017-08-18 NOTE — MAU Provider Note (Signed)
History     CSN: [id number]  Arrival date and time: 08/18/17 1241   First Provider Initiated Contact with Patient 08/18/17 1321      Chief Complaint  Patient presents with  . Hypertension   HPI Toni Sanchez is a 31 y.o. G2P1001 at [redacted]w[redacted]d who presents to MAU today from the office for further evaluation of elevated blood pressure. The patient states that this is the first time her BP has been elevated in this pregnancy. She has very mild headache. She has not taken anything for pain. She denies blurred vision, floaters, edema, RUQ pain, vaginal bleeding, LOF, contractions or complications with the pregnancy. She reports normal fetal movement.   OB History    Gravida Para Term Preterm AB Living   2 1 1     1    SAB TAB Ectopic Multiple Live Births           1      Past Medical History:  Diagnosis Date  . Medical history non-contributory     Past Surgical History:  Procedure Laterality Date  . CHOLECYSTECTOMY  2010  . LAPAROSCOPIC OVARIAN CYSTECTOMY Left 11/26/2016   Procedure: LAPAROSCOPIC OVARIAN CYSTECTOMY;  Surgeon: Janyth Pupa, DO;  Location: [facility];  Service: Gynecology;  Laterality: Left;    Family History  Problem Relation Age of Onset  . Hypertension Mother   . Diabetes Mother   . Hypertension Father     Social History  Substance Use Topics  . Smoking status: Former Smoker    Packs/day: 0.50    Years: 2.00    Types: Cigarettes    Quit date: 11/30/2016  . Smokeless tobacco: Never Used  . Alcohol use No    Allergies: No Known Allergies  Prescriptions Prior to Admission  Medication Sig Dispense Refill Last Dose  . ferrous sulfate 325 (65 FE) MG tablet Take 325 mg by mouth daily with breakfast.   08/17/2017 at Unknown time  . hydrocortisone (ANUSOL-HC) 2.5 % rectal cream Apply rectally 2 times daily 28.35 g 1 08/18/2017 at Unknown time  . lidocaine (XYLOCAINE) 5 % ointment Apply 1 application topically as needed. (Patient taking differently: Apply 1  application topically daily as needed for mild pain. ) 35.44 g 0 Past Week at Unknown time  . Prenatal Vit-Fe Fumarate-FA (PRENATAL MULTIVITAMIN) TABS tablet Take 1 tablet by mouth daily at 12 noon.   08/17/2017 at Unknown time  . acetaminophen (TYLENOL) 325 MG tablet Take 650 mg by mouth every 6 (six) hours as needed for mild pain or headache.   prn    Review of Systems  Constitutional: Negative for fever.  Eyes: Negative for visual disturbance.  Cardiovascular: Negative for leg swelling.  Gastrointestinal: Negative for abdominal pain, constipation, diarrhea, nausea and vomiting.  Genitourinary: Negative for vaginal bleeding and vaginal discharge.  Neurological: Positive for headaches.   Physical Exam   Blood pressure 122/70, pulse 93, temperature 98.2 F (36.8 C), temperature source Oral, resp. rate 19, height 5\' 9"  (1.753 m), weight 293 lb (132.9 kg), last menstrual period 11/10/2016, SpO2 99 %.  Physical Exam  Nursing note and vitals reviewed. Constitutional: She is oriented to person, place, and time. She appears well-developed and well-nourished. No distress.  HENT:  Head: Normocephalic and atraumatic.  Cardiovascular: Normal rate.   Respiratory: Effort normal.  GI: Soft. She exhibits no distension and no mass. There is no tenderness. There is no rebound and no guarding.  Musculoskeletal: She exhibits no edema.  Neurological: She is  alert and oriented to person, place, and time. She has normal reflexes.  No clonus  Skin: Skin is warm and dry. No erythema.  Psychiatric: She has a normal mood and affect.     Results for orders placed or performed during the hospital encounter of 08/18/17 (from the past 24 hour(s))  Protein / creatinine ratio, urine     Status: None   Collection Time: 08/18/17  1:00 PM  Result Value Ref Range   Creatinine, Urine 261.00 mg/dL   Total Protein, Urine 17 mg/dL   Protein Creatinine Ratio 0.07 0.00 - 0.15 mg/mg[Cre]  Urinalysis, Routine w reflex  microscopic     Status: Abnormal   Collection Time: 08/18/17  1:00 PM  Result Value Ref Range   Color, Urine AMBER (A) YELLOW   APPearance CLEAR CLEAR   Specific Gravity, Urine 1.032 (H) 1.005 - 1.030   pH 5.0 5.0 - 8.0   Glucose, UA NEGATIVE NEGATIVE mg/dL   Hgb urine dipstick NEGATIVE NEGATIVE   Bilirubin Urine NEGATIVE NEGATIVE   Ketones, ur NEGATIVE NEGATIVE mg/dL   Protein, ur 30 (A) NEGATIVE mg/dL   Nitrite NEGATIVE NEGATIVE   Leukocytes, UA NEGATIVE NEGATIVE   RBC / HPF 0-5 0 - 5 RBC/hpf   WBC, UA 0-5 0 - 5 WBC/hpf   Bacteria, UA NONE SEEN NONE SEEN   Squamous Epithelial / LPF 0-5 (A) NONE SEEN   Mucus PRESENT   CBC with Differential/Platelet     Status: Abnormal   Collection Time: 08/18/17  1:26 PM  Result Value Ref Range   WBC 6.8 4.0 - 10.5 K/uL   RBC 3.94 3.87 - 5.11 MIL/uL   Hemoglobin 11.7 (L) 12.0 - 15.0 g/dL   HCT 36.2 36.0 - 46.0 %   MCV 91.9 78.0 - 100.0 fL   MCH 29.7 26.0 - 34.0 pg   MCHC 32.3 30.0 - 36.0 g/dL   RDW 13.9 11.5 - 15.5 %   Platelets 236 150 - 400 K/uL   Neutrophils Relative % 68 %   Neutro Abs 4.6 1.7 - 7.7 K/uL   Lymphocytes Relative 27 %   Lymphs Abs 1.8 0.7 - 4.0 K/uL   Monocytes Relative 4 %   Monocytes Absolute 0.3 0.1 - 1.0 K/uL   Eosinophils Relative 1 %   Eosinophils Absolute 0.1 0.0 - 0.7 K/uL   Basophils Relative 0 %   Basophils Absolute 0.0 0.0 - 0.1 K/uL  Comprehensive metabolic panel     Status: Abnormal   Collection Time: 08/18/17  1:26 PM  Result Value Ref Range   Sodium 133 (L) 135 - 145 mmol/L   Potassium 3.9 3.5 - 5.1 mmol/L   Chloride 104 101 - 111 mmol/L   CO2 22 22 - 32 mmol/L   Glucose, Bld 92 65 - 99 mg/dL   BUN 8 6 - 20 mg/dL   Creatinine, Ser 0.61 0.44 - 1.00 mg/dL   Calcium 8.5 (L) 8.9 - 10.3 mg/dL   Total Protein 6.4 (L) 6.5 - 8.1 g/dL   Albumin 2.9 (L) 3.5 - 5.0 g/dL   AST 31 15 - 41 U/L   ALT 54 14 - 54 U/L   Alkaline Phosphatase 182 (H) 38 - 126 U/L   Total Bilirubin 0.6 0.3 - 1.2 mg/dL   GFR calc  non Af Amer >60 >60 mL/min   GFR calc Af Amer >60 >60 mL/min   Anion gap 7 5 - 15    Fetal Monitoring: Baseline: 120 bpm Variability: moderate  Accelerations: 15 x 15 Decelerations: none Contractions: few, irregular, mild  Patient Vitals for the past 24 hrs:  BP Temp Temp src Pulse Resp SpO2 Height Weight  08/18/17 1347 122/70 - - 93 - - - -  08/18/17 1332 128/75 - - 78 - - - -  08/18/17 1316 127/75 - - (!) 103 - - - -  08/18/17 1314 133/71 - - 91 - - - -  08/18/17 1257 (!) 142/81 98.2 F (36.8 C) Oral 79 19 99 % 5\' 9"  (1.753 m) 293 lb (132.9 kg)     MAU Course  Procedures None  MDM Serial BPs CBC, CMP, UA and Urine protein/creatinine ratio today  Discussed with Dr. Nelda Marseille. [place] for discharge at this time. Return to the office for BP check on Thursday.  Assessment and Plan  A: SIUP at [redacted]w[redacted]d Elevated blood pressure in pregnancy  P: Discharge home Tylenol PRN for pain  Pre-eclampsa and labor precautions discussed Patient advised to follow-up with [facility] on Thursday for BP check Patient may return to MAU as needed or if her condition were to change or worsen   Kerry Hough, PA-C 08/18/2017, 2:19 PM

## 2017-08-18 NOTE — Discharge Instructions (Signed)
Preeclampsia and Eclampsia °Preeclampsia is a serious condition that develops only during pregnancy. It is also called toxemia of pregnancy. This condition causes high blood pressure along with other symptoms, such as swelling and headaches. These symptoms may develop as the condition gets worse. Preeclampsia may occur at 20 weeks of pregnancy or later. °Diagnosing and treating preeclampsia early is very important. If not treated early, it can cause serious problems for you and your baby. One problem it can lead to is eclampsia, which is a condition that causes muscle jerking or shaking (convulsions or seizures) in the mother. Delivering your baby is the best treatment for preeclampsia or eclampsia. Preeclampsia and eclampsia symptoms usually go away after your baby is born. °What are the causes? °The cause of preeclampsia is not known. °What increases the risk? °The following risk factors make you more likely to develop preeclampsia: °· Being pregnant for the first time. °· Having had preeclampsia during a past pregnancy. °· Having a family history of preeclampsia. °· Having high blood pressure. °· Being pregnant with twins or triplets. °· Being 35 or older. °· Being African-American. °· Having kidney disease or diabetes. °· Having medical conditions such as lupus or blood diseases. °· Being very overweight (obese). ° °What are the signs or symptoms? °The earliest signs of preeclampsia are: °· High blood pressure. °· Increased protein in your urine. Your health care provider will check for this at every visit before you give birth (prenatal visit). ° °Other symptoms that may develop as the condition gets worse include: °· Severe headaches. °· Sudden weight gain. °· Swelling of the hands, face, legs, and feet. °· Nausea and vomiting. °· Vision problems, such as blurred or double vision. °· Numbness in the face, arms, legs, and feet. °· Urinating less than usual. °· Dizziness. °· Slurred speech. °· Abdominal pain,  especially upper abdominal pain. °· Convulsions or seizures. ° °Symptoms generally go away after giving birth. °How is this diagnosed? °There are no screening tests for preeclampsia. Your health care provider will ask you about symptoms and check for signs of preeclampsia during your prenatal visits. You may also have tests that include: °· Urine tests. °· Blood tests. °· Checking your blood pressure. °· Monitoring your baby’s heart rate. °· Ultrasound. ° °How is this treated? °You and your health care provider will determine the treatment approach that is best for you. Treatment may include: °· Having more frequent prenatal exams to check for signs of preeclampsia, if you have an increased risk for preeclampsia. °· Bed rest. °· Reducing how much salt (sodium) you eat. °· Medicine to lower your blood pressure. °· Staying in the hospital, if your condition is severe. There, treatment will focus on controlling your blood pressure and the amount of fluids in your body (fluid retention). °· You may need to take medicine (magnesium sulfate) to prevent seizures. This medicine may be given as an injection or through an IV tube. °· Delivering your baby early, if your condition gets worse. You may have your labor started with medicine (induced), or you may have a cesarean delivery. ° °Follow these instructions at home: °Eating and drinking ° °· Drink enough fluid to keep your urine clear or pale yellow. °· Eat a healthy diet that is low in sodium. Do not add salt to your food. Check nutrition labels to see how much sodium a food or beverage contains. °· Avoid caffeine. °Lifestyle °· Do not use any products that contain nicotine or tobacco, such as cigarettes   and e-cigarettes. If you need help quitting, ask your health care provider. °· Do not use alcohol or drugs. °· Avoid stress as much as possible. Rest and get plenty of sleep. °General instructions °· Take over-the-counter and prescription medicines only as told by your  health care provider. °· When lying down, lie on your side. This keeps pressure off of your baby. °· When sitting or lying down, raise (elevate) your feet. Try putting some pillows underneath your lower legs. °· Exercise regularly. Ask your health care provider what kinds of exercise are best for you. °· Keep all follow-up and prenatal visits as told by your health care provider. This is important. °How is this prevented? °To prevent preeclampsia or eclampsia from developing during another pregnancy: °· Get proper medical care during pregnancy. Your health care provider may be able to prevent preeclampsia or diagnose and treat it early. °· Your health care provider may have you take a low-dose aspirin or a calcium supplement during your next pregnancy. °· You may have tests of your blood pressure and kidney function after giving birth. °· Maintain a healthy weight. Ask your health care provider for help managing weight gain during pregnancy. °· Work with your health care provider to manage any long-term (chronic) health conditions you have, such as diabetes or kidney problems. ° °Contact a health care provider if: °· You gain more weight than expected. °· You have headaches. °· You have nausea or vomiting. °· You have abdominal pain. °· You feel dizzy or light-headed. °Get help right away if: °· You develop sudden or severe swelling anywhere in your body. This usually happens in the legs. °· You gain 5 lbs (2.3 kg) or more during one week. °· You have severe: °? Abdominal pain. °? Headaches. °? Dizziness. °? Vision problems. °? Confusion. °? Nausea or vomiting. °· You have a seizure. °· You have trouble moving any part of your body. °· You develop numbness in any part of your body. °· You have trouble speaking. °· You have any abnormal bleeding. °· You pass out. °This information is not intended to replace advice given to you by your health care provider. Make sure you discuss any questions you have with your health  care provider. °Document Released: 10/31/2000 Document Revised: 07/01/2016 Document Reviewed: 06/09/2016 °Elsevier Interactive Patient Education © 2018 Else[employer] Inc[employer] ° °

## 2017-08-18 NOTE — MAU Note (Signed)
Pt sent from office with protein in urine and b/p 140/80's

## 2017-09-01 ENCOUNTER — Inpatient Hospital Stay (EMERGENCY_DEPARTMENT_HOSPITAL)
Admission: AD | Admit: 2017-09-01 | Discharge: 2017-09-01 | Disposition: A | Discharge status: Home / Self Care | Payer: Medicaid Other | Source: Ambulatory Visit | Attending: Obstetrics & Gynecology | Admitting: Obstetrics & Gynecology

## 2017-09-01 DIAGNOSIS — Z3A39 39 weeks gestation of pregnancy: Secondary | ICD-10-CM

## 2017-09-01 DIAGNOSIS — O133 Gestational [pregnancy-induced] hypertension without significant proteinuria, third trimester: Secondary | ICD-10-CM

## 2017-09-01 DIAGNOSIS — O9989 Other specified diseases and conditions complicating pregnancy, childbirth and the puerperium: Secondary | ICD-10-CM

## 2017-09-01 DIAGNOSIS — R0989 Other specified symptoms and signs involving the circulatory and respiratory systems: Secondary | ICD-10-CM

## 2017-09-01 DIAGNOSIS — R51 Headache: Secondary | ICD-10-CM

## 2017-09-01 LAB — COMPREHENSIVE METABOLIC PANEL
ALBUMIN: 2.8 g/dL — AB (ref 3.5–5.0)
ALT: 38 U/L (ref 14–54)
ANION GAP: 10 (ref 5–15)
AST: 26 U/L (ref 15–41)
Alkaline Phosphatase: 221 U/L — ABNORMAL HIGH (ref 38–126)
BILIRUBIN TOTAL: 0.6 mg/dL (ref 0.3–1.2)
BUN: 7 mg/dL (ref 6–20)
CHLORIDE: 110 mmol/L (ref 101–111)
CO2: 19 mmol/L — ABNORMAL LOW (ref 22–32)
Calcium: 8.6 mg/dL — ABNORMAL LOW (ref 8.9–10.3)
Creatinine, Ser: 0.45 mg/dL (ref 0.44–1.00)
GFR calc Af Amer: >60 mL/min (ref >60)
Glucose, Bld: 123 mg/dL — ABNORMAL HIGH (ref 65–99)
POTASSIUM: 3.5 mmol/L (ref 3.5–5.1)
Sodium: 139 mmol/L (ref 135–145)
Total Protein: 6.2 g/dL — ABNORMAL LOW (ref 6.5–8.1)

## 2017-09-01 LAB — URINALYSIS, ROUTINE W REFLEX MICROSCOPIC
Bilirubin Urine: NEGATIVE
GLUCOSE, UA: NEGATIVE mg/dL
HGB URINE DIPSTICK: NEGATIVE
KETONES UR: NEGATIVE mg/dL
Leukocytes, UA: NEGATIVE
NITRITE: NEGATIVE
PROTEIN: 30 mg/dL — AB
Specific Gravity, Urine: 1.029 (ref 1.005–1.030)
pH: 5 (ref 5.0–8.0)

## 2017-09-01 LAB — CBC WITH DIFFERENTIAL/PLATELET
BASOS ABS: 0 10*3/uL (ref 0.0–0.1)
BASOS PCT: 0 %
EOS PCT: 1 %
Eosinophils Absolute: 0 10*3/uL (ref 0.0–0.7)
HEMATOCRIT: 35.5 % — AB (ref 36.0–46.0)
Hemoglobin: 11.5 g/dL — ABNORMAL LOW (ref 12.0–15.0)
Lymphocytes Relative: 28 %
Lymphs Abs: 1.6 10*3/uL (ref 0.7–4.0)
MCH: 29.6 pg (ref 26.0–34.0)
MCHC: 32.4 g/dL (ref 30.0–36.0)
MCV: 91.3 fL (ref 78.0–100.0)
MONO ABS: 0.1 10*3/uL (ref 0.1–1.0)
MONOS PCT: 3 %
Neutro Abs: 3.8 10*3/uL (ref 1.7–7.7)
Neutrophils Relative %: 68 %
PLATELETS: 218 10*3/uL (ref 150–400)
RBC: 3.89 MIL/uL (ref 3.87–5.11)
RDW: 14 % (ref 11.5–15.5)
WBC: 5.5 10*3/uL (ref 4.0–10.5)

## 2017-09-01 LAB — PROTEIN / CREATININE RATIO, URINE
CREATININE, URINE: 285 mg/dL
Protein Creatinine Ratio: 0.08 mg/mg{Cre} (ref 0.00–0.15)
Total Protein, Urine: 22 mg/dL

## 2017-09-01 MED ORDER — LABETALOL HCL 5 MG/ML IV SOLN: 20.0000 mg | INTRAVENOUS | Status: DC | PRN

## 2017-09-01 MED ORDER — HYDRALAZINE HCL 20 MG/ML IJ SOLN: 5.0000 mg | INTRAMUSCULAR | Status: DC | PRN

## 2017-09-01 NOTE — MAU Note (Signed)
Sent from office, BP elevated. Pt has headache, started yesterday- had not taken anything, was given Tylenol at the office. +1 protein in urine.  Denies visual changes, increased swelling or epigastric pain.

## 2017-09-01 NOTE — MAU Provider Note (Signed)
History     CSN: [id number]  Arrival date and time: 09/01/17 2947   First Provider Initiated Contact with Patient 09/01/17 1003      Chief Complaint  Patient presents with  . Hypertension  . Headache   HPI [name] is a 31 y.o. G2P1001 at [redacted]w[redacted]d who presents to MAU today for further evaluation of elevated blood pressure from the office. The BP in the office per patient was 138/90. The patient has had intermittent high blood pressures recently. She endorses headache today rated at 6/10 now. Tylenol was given in the office. She denies floaters, blurred vision, peripheral edema, RUQ abdominal pain, vaginal bleeding, LOF or contractions today. She reports normal fetal movement. She is scheduled for induction tonight.    OB History    Gravida Para Term Preterm AB Living   2 1 1     1    SAB TAB Ectopic Multiple Live Births           1      Past Medical History:  Diagnosis Date  . Medical history non-contributory     Past Surgical History:  Procedure Laterality Date  . CHOLECYSTECTOMY  2010  . LAPAROSCOPIC OVARIAN CYSTECTOMY Left 11/26/2016   Procedure: LAPAROSCOPIC OVARIAN CYSTECTOMY;  Surgeon: Janyth Pupa, DO;  Location: [facility];  Service: Gynecology;  Laterality: Left;    Family History  Problem Relation Age of Onset  . Hypertension Mother   . Diabetes Mother   . Hypertension Father     Social History  Substance Use Topics  . Smoking status: Former Smoker    Packs/day: 0.50    Years: 2.00    Types: Cigarettes    Quit date: 11/30/2016  . Smokeless tobacco: Never Used  . Alcohol use No    Allergies: No Known Allergies  Prescriptions Prior to Admission  Medication Sig Dispense Refill Last Dose  . acetaminophen (TYLENOL) 325 MG tablet Take 650 mg by mouth every 6 (six) hours as needed for mild pain or headache.   09/01/2017 at Unknown time  . ferrous sulfate 325 (65 FE) MG tablet Take 325 mg by mouth daily with breakfast.   09/01/2017 at Unknown time  .  Prenatal Vit-Fe Fumarate-FA (PRENATAL MULTIVITAMIN) TABS tablet Take 1 tablet by mouth daily at 12 noon.   08/31/2017 at Unknown time  . hydrocortisone (ANUSOL-HC) 2.5 % rectal cream Apply rectally 2 times daily 28.35 g 1 08/30/2017  . lidocaine (XYLOCAINE) 5 % ointment Apply 1 application topically as needed. (Patient taking differently: Apply 1 application topically daily as needed for mild pain. ) 35.44 g 0 08/25/2017    Review of Systems  Constitutional: Negative for fever.  Eyes: Negative for visual disturbance.  Cardiovascular: Negative for leg swelling.  Gastrointestinal: Negative for abdominal pain.  Genitourinary: Negative for vaginal bleeding and vaginal discharge.  Neurological: Positive for headaches.   Physical Exam   Blood pressure (!) 108/57, pulse 79, temperature 98.4 F (36.9 C), temperature source Oral, resp. rate 20, weight 297 lb 4 oz (134.8 kg), last menstrual period 11/10/2016, SpO2 98 %.  Physical Exam  Nursing note and vitals reviewed. Constitutional: She is oriented to person, place, and time. She appears well-developed and well-nourished. No distress.  HENT:  Head: Normocephalic and atraumatic.  Cardiovascular: Normal rate.   Respiratory: Effort normal.  GI: Soft. She exhibits no distension and no mass. There is no tenderness. There is no rebound and no guarding.  Musculoskeletal: She exhibits no edema.  Neurological: She  is alert and oriented to person, place, and time. She has normal reflexes.  No clonus  Skin: Skin is warm and dry. No erythema.  Psychiatric: She has a normal mood and affect.   Results for orders placed or performed during the hospital encounter of 09/01/17 (from the past 24 hour(s))  Protein / creatinine ratio, urine     Status: None   Collection Time: 09/01/17  9:40 AM  Result Value Ref Range   Creatinine, Urine 285.00 mg/dL   Total Protein, Urine 22 mg/dL   Protein Creatinine Ratio 0.08 0.00 - 0.15 mg/mg[Cre]  Urinalysis, Routine w  reflex microscopic     Status: Abnormal   Collection Time: 09/01/17  9:40 AM  Result Value Ref Range   Color, Urine AMBER (A) YELLOW   APPearance HAZY (A) CLEAR   Specific Gravity, Urine 1.029 1.005 - 1.030   pH 5.0 5.0 - 8.0   Glucose, UA NEGATIVE NEGATIVE mg/dL   Hgb urine dipstick NEGATIVE NEGATIVE   Bilirubin Urine NEGATIVE NEGATIVE   Ketones, ur NEGATIVE NEGATIVE mg/dL   Protein, ur 30 (A) NEGATIVE mg/dL   Nitrite NEGATIVE NEGATIVE   Leukocytes, UA NEGATIVE NEGATIVE   RBC / HPF 0-5 0 - 5 RBC/hpf   WBC, UA 0-5 0 - 5 WBC/hpf   Bacteria, UA RARE (A) NONE SEEN   Squamous Epithelial / LPF 6-30 (A) NONE SEEN   Mucus PRESENT   CBC with Differential/Platelet     Status: Abnormal   Collection Time: 09/01/17 10:28 AM  Result Value Ref Range   WBC 5.5 4.0 - 10.5 K/uL   RBC 3.89 3.87 - 5.11 MIL/uL   Hemoglobin 11.5 (L) 12.0 - 15.0 g/dL   HCT 35.5 (L) 36.0 - 46.0 %   MCV 91.3 78.0 - 100.0 fL   MCH 29.6 26.0 - 34.0 pg   MCHC 32.4 30.0 - 36.0 g/dL   RDW 14.0 11.5 - 15.5 %   Platelets 218 150 - 400 K/uL   Neutrophils Relative % 68 %   Neutro Abs 3.8 1.7 - 7.7 K/uL   Lymphocytes Relative 28 %   Lymphs Abs 1.6 0.7 - 4.0 K/uL   Monocytes Relative 3 %   Monocytes Absolute 0.1 0.1 - 1.0 K/uL   Eosinophils Relative 1 %   Eosinophils Absolute 0.0 0.0 - 0.7 K/uL   Basophils Relative 0 %   Basophils Absolute 0.0 0.0 - 0.1 K/uL  Comprehensive metabolic panel     Status: Abnormal   Collection Time: 09/01/17 10:28 AM  Result Value Ref Range   Sodium 139 135 - 145 mmol/L   Potassium 3.5 3.5 - 5.1 mmol/L   Chloride 110 101 - 111 mmol/L   CO2 19 (L) 22 - 32 mmol/L   Glucose, Bld 123 (H) 65 - 99 mg/dL   BUN 7 6 - 20 mg/dL   Creatinine, Ser 0.45 0.44 - 1.00 mg/dL   Calcium 8.6 (L) 8.9 - 10.3 mg/dL   Total Protein 6.2 (L) 6.5 - 8.1 g/dL   Albumin 2.8 (L) 3.5 - 5.0 g/dL   AST 26 15 - 41 U/L   ALT 38 14 - 54 U/L   Alkaline Phosphatase 221 (H) 38 - 126 U/L   Total Bilirubin 0.6 0.3 - 1.2  mg/dL   GFR calc non Af Amer >60 >60 mL/min   GFR calc Af Amer >60 >60 mL/min   Anion gap 10 5 - 15   Patient Vitals for the past 24 hrs:  BP  Temp Temp src Pulse Resp SpO2 Weight  09/01/17 1100 (!) 108/57 - - 79 - - -  09/01/17 1045 130/80 - - 78 - - -  09/01/17 1030 131/69 - - 81 - - -  09/01/17 1015 128/76 - - 84 - - -  09/01/17 1000 136/74 - - 80 - - -  09/01/17 0956 128/77 - - 89 - - -  09/01/17 0939 133/81 98.4 F (36.9 C) Oral 84 20 98 % 297 lb 4 oz (134.8 kg)   Fetal Monitoring: Baseline: 130 bpm Variability: moderate Accelerations: 15 x 15 Decelerations: none Contractions: none, mild UI  MAU Course  Procedures None  MDM Serial BPs CBC, CMP, UA, Urine Protein/Creatinine Ratio today  Discussed patient with Dr. Nelda Marseille. [place] for discharge at this time. Follow-up tonight in MAU for admission for induction for intermittent HTN during pregnancy.   Assessment and Plan  A: SIUP at [redacted]w[redacted]d Elevated blood pressure in pregnancy, third trimester  P: Discharge home Tylenol PRN for pain  Labor precautions and s/s of worsening condition discussed Patient advised to follow-up with [facility] admissions at 11:45 pm tonight for induction  Patient may return to MAU as needed or if her condition were to change or worsen   Kerry Hough, PA-C 09/01/2017, 11:11 AM

## 2017-09-01 NOTE — H&P (Signed)
HPI: 31 y/o G2P1001 @ [redacted]w[redacted]d estimated gestational age (as dated by LMP c/w 20 week ultrasound) presents for scheduled IOL for gestational HTN.   no Leaking of Fluid,   no Vaginal Bleeding,   no Uterine Contractions,  + Fetal Movement.  Prenatal care has been provided by Dr. Nelda Marseille  Of note, pt was seen yesterday due to headache and elevated blood pressure.  She was ruled out for preeclampsia; however, she still notes a mild headache.  No epigastric pain, no visual changes  Pregnancy complicated by: 1) Gestational HTN- lab work negative for preeclampsia (see below labs) 2) Size greater than dates- suspected LGA- last [place] @ [redacted]w[redacted]d- 8#8oz (>90%) 3) Morbid obesity: BMI 42 4) Anemia- on iron daily- Hgb 11.5   Prenatal Transfer Tool  Maternal Diabetes: No Genetic Screening: Normal Maternal Ultrasounds/Referrals: Normal Fetal Ultrasounds or other Referrals:  None Maternal Substance Abuse:  No Significant Maternal Medications:  None Significant Maternal Lab Results: Lab values include: Group B Strep negative   PNL:  GBS neg, Rub Immune, Hep B neg, RPR NR, HIV neg, GC/C neg, glucola:122 Hgb: 11.5 Blood type: A positive, antibody neg  Immunizations: Tdap: 8/13  OBHx: FTNSVD, 8#, uncomplicated PMHx:  none Meds:  PNV, iron Allergy:  No Known Allergies SurgHx: 2010- cholecystectomy 2018- Left oophorectomy 2/2 ovarian teratoma SocHx:   no Tobacco (+tobacco use prior ot pregnancy), no  EtOH, no Illicit Drugs  O: LMP 19/41/7408 (Exact Date)   Gen. AAOx3, NAD CV.  RRR  No murmur.  Resp. CTAB, no wheeze or crackles. Abd. Gravid,  no tenderness,  no rigidity,  no guarding Extr.  No edema B/L , no calf tenderness, neg Homan's B/L  FHT: 130 baseline, moderate variability, + accels,  no decels Toco: non SVE: 2/25/-3, soft, mid position, vertex on exam  Labs:  Results for orders placed or performed during the hospital encounter of 09/01/17 (from the past 24 hour(s))  Protein / creatinine  ratio, urine     Status: None   Collection Time: 09/01/17  9:40 AM  Result Value Ref Range   Creatinine, Urine 285.00 mg/dL   Total Protein, Urine 22 mg/dL   Protein Creatinine Ratio 0.08 0.00 - 0.15 mg/mg[Cre]  Urinalysis, Routine w reflex microscopic     Status: Abnormal   Collection Time: 09/01/17  9:40 AM  Result Value Ref Range   Color, Urine AMBER (A) YELLOW   APPearance HAZY (A) CLEAR   Specific Gravity, Urine 1.029 1.005 - 1.030   pH 5.0 5.0 - 8.0   Glucose, UA NEGATIVE NEGATIVE mg/dL   Hgb urine dipstick NEGATIVE NEGATIVE   Bilirubin Urine NEGATIVE NEGATIVE   Ketones, ur NEGATIVE NEGATIVE mg/dL   Protein, ur 30 (A) NEGATIVE mg/dL   Nitrite NEGATIVE NEGATIVE   Leukocytes, UA NEGATIVE NEGATIVE   RBC / HPF 0-5 0 - 5 RBC/hpf   WBC, UA 0-5 0 - 5 WBC/hpf   Bacteria, UA RARE (A) NONE SEEN   Squamous Epithelial / LPF 6-30 (A) NONE SEEN   Mucus PRESENT   CBC with Differential/Platelet     Status: Abnormal   Collection Time: 09/01/17 10:28 AM  Result Value Ref Range   WBC 5.5 4.0 - 10.5 K/uL   RBC 3.89 3.87 - 5.11 MIL/uL   Hemoglobin 11.5 (L) 12.0 - 15.0 g/dL   HCT 35.5 (L) 36.0 - 46.0 %   MCV 91.3 78.0 - 100.0 fL   MCH 29.6 26.0 - 34.0 pg   MCHC 32.4 30.0 -  36.0 g/dL   RDW 14.0 11.5 - 15.5 %   Platelets 218 150 - 400 K/uL   Neutrophils Relative % 68 %   Neutro Abs 3.8 1.7 - 7.7 K/uL   Lymphocytes Relative 28 %   Lymphs Abs 1.6 0.7 - 4.0 K/uL   Monocytes Relative 3 %   Monocytes Absolute 0.1 0.1 - 1.0 K/uL   Eosinophils Relative 1 %   Eosinophils Absolute 0.0 0.0 - 0.7 K/uL   Basophils Relative 0 %   Basophils Absolute 0.0 0.0 - 0.1 K/uL  Comprehensive metabolic panel     Status: Abnormal   Collection Time: 09/01/17 10:28 AM  Result Value Ref Range   Sodium 139 135 - 145 mmol/L   Potassium 3.5 3.5 - 5.1 mmol/L   Chloride 110 101 - 111 mmol/L   CO2 19 (L) 22 - 32 mmol/L   Glucose, Bld 123 (H) 65 - 99 mg/dL   BUN 7 6 - 20 mg/dL   Creatinine, Ser 0.45 0.44 - 1.00  mg/dL   Calcium 8.6 (L) 8.9 - 10.3 mg/dL   Total Protein 6.2 (L) 6.5 - 8.1 g/dL   Albumin 2.8 (L) 3.5 - 5.0 g/dL   AST 26 15 - 41 U/L   ALT 38 14 - 54 U/L   Alkaline Phosphatase 221 (H) 38 - 126 U/L   Total Bilirubin 0.6 0.3 - 1.2 mg/dL   GFR calc non Af Amer >60 >60 mL/min   GFR calc Af Amer >60 >60 mL/min   Anion gap 10 5 - 15     A/P:  31 y.o. G2P1001 @ [redacted]w[redacted]d EGA who presents for IOL due to gestational HTN -FWB:  NICHD Cat I FHTs -Labor: plan for cytotec per protocol -GBS: neg -Gestational HTN- labs last performed on 10/16, plan to continue to closely monitor, IV Hydralazine protocol written if needed -Pain management: IV or epidural upon request -LGA: Discussed with patient concern for difficulty delivery including shoulder dystocia and possible C-section. Pt aware and wishes to proceed  Janyth Pupa, DO [phone] (pager) [phone] (office)

## 2017-09-01 NOTE — Discharge Instructions (Signed)
Hypertension During Pregnancy Hypertension is also called high blood pressure. High blood pressure means that the force of your blood moving in your body is too strong. When you are pregnant, this condition should be watched carefully. It can cause problems for you and your baby. Follow these instructions at home: Eating and drinking  Drink enough fluid to keep your pee (urine) clear or pale yellow.  Eat healthy foods that are low in salt (sodium). ? Do not add salt to your food. ? Check labels on foods and drinks to see much salt is in them. Look on the label where you see "Sodium." Lifestyle  Do not use any products that contain nicotine or tobacco, such as cigarettes and e-cigarettes. If you need help quitting, ask your doctor.  Do not use alcohol.  Avoid caffeine.  Avoid stress. Rest and get plenty of sleep. General instructions  Take over-the-counter and prescription medicines only as told by your doctor.  While lying down, lie on your left side. This keeps pressure off your baby.  While sitting or lying down, raise (elevate) your feet. Try putting some pillows under your lower legs.  Exercise regularly. Ask your doctor what kinds of exercise are best for you.  Keep all prenatal and follow-up visits as told by your doctor. This is important. Contact a doctor if:  You have symptoms that your doctor told you to watch for, such as: ? Fever. ? Throwing up (vomiting). ? Headache. Get help right away if:  You have very bad pain in your belly (abdomen).  You are throwing up, and this does not get better with treatment.  You suddenly get swelling in your hands, ankles, or face.  You gain 4 lb (1.8 kg) or more in 1 week.  You get bleeding from your vagina.  You have blood in your pee.  You do not feel your baby moving as much as normal.  You have a change in vision.  You have muscle twitching or sudden tightening (spasms).  You have trouble breathing.  Your lips  or fingernails turn blue. This information is not intended to replace advice given to you by your health care provider. Make sure you discuss any questions you have with your health care provider. Document Released: 12/06/2010 Document Revised: 07/15/2016 Document Reviewed: 07/15/2016 Elsevier Interactive Patient Education  2017 [employer].

## 2017-09-02 ENCOUNTER — Encounter (HOSPITAL_COMMUNITY): Payer: Self-pay

## 2017-09-02 ENCOUNTER — Inpatient Hospital Stay (HOSPITAL_COMMUNITY)
Admission: RE | Admit: 2017-09-02 | Discharge: 2017-09-04 | DRG: 806 | Disposition: A | Discharge status: Home / Self Care | Payer: Medicaid Other | Source: Ambulatory Visit | Attending: Obstetrics & Gynecology | Admitting: Obstetrics & Gynecology

## 2017-09-02 DIAGNOSIS — O139 Gestational [pregnancy-induced] hypertension without significant proteinuria, unspecified trimester: Secondary | ICD-10-CM | POA: Diagnosis present

## 2017-09-02 DIAGNOSIS — O3663X0 Maternal care for excessive fetal growth, third trimester, not applicable or unspecified: Secondary | ICD-10-CM | POA: Diagnosis present

## 2017-09-02 DIAGNOSIS — O99214 Obesity complicating childbirth: Secondary | ICD-10-CM | POA: Diagnosis present

## 2017-09-02 DIAGNOSIS — D649 Anemia, unspecified: Secondary | ICD-10-CM | POA: Diagnosis present

## 2017-09-02 DIAGNOSIS — O134 Gestational [pregnancy-induced] hypertension without significant proteinuria, complicating childbirth: Secondary | ICD-10-CM | POA: Diagnosis present

## 2017-09-02 DIAGNOSIS — O9902 Anemia complicating childbirth: Secondary | ICD-10-CM | POA: Diagnosis present

## 2017-09-02 DIAGNOSIS — O9989 Other specified diseases and conditions complicating pregnancy, childbirth and the puerperium: Secondary | ICD-10-CM | POA: Diagnosis not present

## 2017-09-02 DIAGNOSIS — R51 Headache: Secondary | ICD-10-CM | POA: Diagnosis not present

## 2017-09-02 DIAGNOSIS — Z3A39 39 weeks gestation of pregnancy: Secondary | ICD-10-CM

## 2017-09-02 DIAGNOSIS — Z6841 Body Mass Index (BMI) 40.0 and over, adult: Secondary | ICD-10-CM | POA: Diagnosis not present

## 2017-09-02 DIAGNOSIS — O133 Gestational [pregnancy-induced] hypertension without significant proteinuria, third trimester: Secondary | ICD-10-CM | POA: Diagnosis not present

## 2017-09-02 DIAGNOSIS — Z87891 Personal history of nicotine dependence: Secondary | ICD-10-CM | POA: Diagnosis not present

## 2017-09-02 LAB — RPR: RPR Ser Ql: NONREACTIVE

## 2017-09-02 LAB — CBC
HCT: 34.7 % — ABNORMAL LOW (ref 36.0–46.0)
Hemoglobin: 11.4 g/dL — ABNORMAL LOW (ref 12.0–15.0)
MCH: 29.8 pg (ref 26.0–34.0)
MCHC: 32.9 g/dL (ref 30.0–36.0)
MCV: 90.8 fL (ref 78.0–100.0)
PLATELETS: 218 10*3/uL (ref 150–400)
RBC: 3.82 MIL/uL — AB (ref 3.87–5.11)
RDW: 13.7 % (ref 11.5–15.5)
WBC: 6.8 10*3/uL (ref 4.0–10.5)

## 2017-09-02 LAB — TYPE AND SCREEN
ABO/RH(D): A POS
Antibody Screen: NEGATIVE

## 2017-09-02 MED ORDER — ONDANSETRON HCL 4 MG/2ML IJ SOLN: 4.0000 mg | INTRAMUSCULAR | Status: DC | PRN

## 2017-09-02 MED ORDER — OXYTOCIN BOLUS FROM INFUSION: 500.0000 mL | Freq: Once | INTRAVENOUS | Status: DC

## 2017-09-02 MED ORDER — OXYTOCIN 10 UNIT/ML IJ SOLN
10.0000 [IU] | Freq: Once | INTRAMUSCULAR | Status: AC
  Administered 2017-09-02: 10 [IU] via INTRAMUSCULAR

## 2017-09-02 MED ORDER — ACETAMINOPHEN 325 MG PO TABS
650.0000 mg | ORAL_TABLET | ORAL | Status: DC | PRN
  Administered 2017-09-02 – 2017-09-03 (×2): 650 mg via ORAL
  Filled 2017-09-02 (×2): qty 2

## 2017-09-02 MED ORDER — DIBUCAINE 1 % RE OINT
1.0000 "application " | TOPICAL_OINTMENT | RECTAL | Status: DC | PRN
  Administered 2017-09-02: 1 via RECTAL
  Filled 2017-09-02: qty 28

## 2017-09-02 MED ORDER — SOD CITRATE-CITRIC ACID 500-334 MG/5ML PO SOLN: 30.0000 mL | ORAL | Status: DC | PRN

## 2017-09-02 MED ORDER — LACTATED RINGERS IV SOLN: 500.0000 mL | Freq: Once | INTRAVENOUS | Status: DC

## 2017-09-02 MED ORDER — OXYTOCIN 40 UNITS IN LACTATED RINGERS INFUSION - SIMPLE MED
1.0000 m[IU]/min | INTRAVENOUS | Status: DC
  Administered 2017-09-02: 1 m[IU]/min via INTRAVENOUS
  Filled 2017-09-02: qty 1000

## 2017-09-02 MED ORDER — PRENATAL MULTIVITAMIN CH
1.0000 | ORAL_TABLET | Freq: Every day | ORAL | Status: DC
  Administered 2017-09-02 – 2017-09-03 (×2): 1 via ORAL
  Filled 2017-09-02 (×2): qty 1

## 2017-09-02 MED ORDER — DIPHENHYDRAMINE HCL 50 MG/ML IJ SOLN: 12.5000 mg | INTRAMUSCULAR | Status: DC | PRN

## 2017-09-02 MED ORDER — IBUPROFEN 600 MG PO TABS
600.0000 mg | ORAL_TABLET | Freq: Four times a day (QID) | ORAL | Status: DC
  Administered 2017-09-02 – 2017-09-04 (×8): 600 mg via ORAL
  Filled 2017-09-02 (×8): qty 1

## 2017-09-02 MED ORDER — FENTANYL CITRATE (PF) 100 MCG/2ML IJ SOLN
50.0000 ug | INTRAMUSCULAR | Status: DC | PRN
Start: 2017-09-02 — End: 2017-09-02
  Administered 2017-09-02 (×3): 100 ug via INTRAVENOUS
  Filled 2017-09-02 (×3): qty 2

## 2017-09-02 MED ORDER — METHYLERGONOVINE MALEATE 0.2 MG/ML IJ SOLN: 0.2000 mg | INTRAMUSCULAR | Status: DC | PRN

## 2017-09-02 MED ORDER — OXYCODONE-ACETAMINOPHEN 5-325 MG PO TABS
2.0000 | ORAL_TABLET | ORAL | Status: DC | PRN
Start: 2017-09-02 — End: 2017-09-02

## 2017-09-02 MED ORDER — ONDANSETRON HCL 4 MG PO TABS: 4.0000 mg | ORAL_TABLET | ORAL | Status: DC | PRN

## 2017-09-02 MED ORDER — OXYTOCIN 40 UNITS IN LACTATED RINGERS INFUSION - SIMPLE MED: 2.5000 [IU]/h | INTRAVENOUS | Status: DC

## 2017-09-02 MED ORDER — ZOLPIDEM TARTRATE 5 MG PO TABS: 5.0000 mg | ORAL_TABLET | Freq: Every evening | ORAL | Status: DC | PRN

## 2017-09-02 MED ORDER — OXYCODONE-ACETAMINOPHEN 5-325 MG PO TABS: 1.0000 | ORAL_TABLET | ORAL | Status: DC | PRN

## 2017-09-02 MED ORDER — TERBUTALINE SULFATE 1 MG/ML IJ SOLN
0.2500 mg | Freq: Once | INTRAMUSCULAR | Status: DC | PRN
  Filled 2017-09-02: qty 1

## 2017-09-02 MED ORDER — EPHEDRINE 5 MG/ML INJ
10.0000 mg | INTRAVENOUS | Status: DC | PRN
  Filled 2017-09-02: qty 2

## 2017-09-02 MED ORDER — METHYLERGONOVINE MALEATE 0.2 MG PO TABS: 0.2000 mg | ORAL_TABLET | ORAL | Status: DC | PRN

## 2017-09-02 MED ORDER — ACETAMINOPHEN 325 MG PO TABS: 650.0000 mg | ORAL_TABLET | ORAL | Status: DC | PRN

## 2017-09-02 MED ORDER — BENZOCAINE-MENTHOL 20-0.5 % EX AERO
1.0000 "application " | INHALATION_SPRAY | CUTANEOUS | Status: DC | PRN
  Administered 2017-09-02: 1 via TOPICAL
  Filled 2017-09-02: qty 56

## 2017-09-02 MED ORDER — DIPHENHYDRAMINE HCL 25 MG PO CAPS: 25.0000 mg | ORAL_CAPSULE | Freq: Four times a day (QID) | ORAL | Status: DC | PRN

## 2017-09-02 MED ORDER — FENTANYL 2.5 MCG/ML BUPIVACAINE 1/10 % EPIDURAL INFUSION (WH - ANES): 14.0000 mL/h | INTRAMUSCULAR | Status: DC | PRN

## 2017-09-02 MED ORDER — OXYTOCIN 10 UNIT/ML IJ SOLN
INTRAMUSCULAR | Status: AC
  Administered 2017-09-02: 10 [IU] via INTRAMUSCULAR
  Filled 2017-09-02: qty 1

## 2017-09-02 MED ORDER — COCONUT OIL OIL: 1.0000 "application " | TOPICAL_OIL | Status: DC | PRN

## 2017-09-02 MED ORDER — SENNOSIDES-DOCUSATE SODIUM 8.6-50 MG PO TABS
2.0000 | ORAL_TABLET | ORAL | Status: DC
  Administered 2017-09-02 – 2017-09-04 (×2): 2 via ORAL
  Filled 2017-09-02 (×2): qty 2

## 2017-09-02 MED ORDER — SIMETHICONE 80 MG PO CHEW: 80.0000 mg | CHEWABLE_TABLET | ORAL | Status: DC | PRN

## 2017-09-02 MED ORDER — ONDANSETRON HCL 4 MG/2ML IJ SOLN: 4.0000 mg | Freq: Four times a day (QID) | INTRAMUSCULAR | Status: DC | PRN

## 2017-09-02 MED ORDER — LACTATED RINGERS IV SOLN: 500.0000 mL | INTRAVENOUS | Status: DC | PRN

## 2017-09-02 MED ORDER — PHENYLEPHRINE 40 MCG/ML (10ML) SYRINGE FOR IV PUSH (FOR BLOOD PRESSURE SUPPORT)
80.0000 ug | PREFILLED_SYRINGE | INTRAVENOUS | Status: DC | PRN
  Filled 2017-09-02: qty 5

## 2017-09-02 MED ORDER — LIDOCAINE HCL (PF) 1 % IJ SOLN
30.0000 mL | INTRAMUSCULAR | Status: DC | PRN
  Filled 2017-09-02: qty 30

## 2017-09-02 MED ORDER — WITCH HAZEL-GLYCERIN EX PADS
1.0000 "application " | MEDICATED_PAD | CUTANEOUS | Status: DC | PRN
  Administered 2017-09-02: 1 via TOPICAL

## 2017-09-02 MED ORDER — MISOPROSTOL 25 MCG QUARTER TABLET
25.0000 ug | ORAL_TABLET | ORAL | Status: DC | PRN
  Administered 2017-09-02: 25 ug via VAGINAL
  Filled 2017-09-02 (×2): qty 1

## 2017-09-02 MED ORDER — LACTATED RINGERS IV SOLN
INTRAVENOUS | Status: DC
  Administered 2017-09-02 (×2): via INTRAVENOUS

## 2017-09-02 NOTE — Progress Notes (Signed)
OB PN:  S: Pt still feeling pressure and discomfort  O: BP 119/73   Pulse 63   Temp 97.9 F (36.6 C) (Oral)   Resp 18   Ht 5\' 9"  (1.753 m)   Wt 134.7 kg (297 lb)   LMP 11/10/2016 (Exact Date)   BMI 43.86 kg/m   FHT: 135bpm, moderate variablity, + accels, no decels Toco: q1-69min SVE: 5/80/2  A/P: 31 y.o. G2P1001 @ [redacted]w[redacted]d for IOL due to gestational HTN 1. FWB: Cat. I 2. Labor: continue Pit per protocol Pain: IV fentanyl currently GBS: neg Gestational HTN: BP within normal limits, will continue to closely monitor   Janyth Pupa, DO [phone] (pager) [phone] (office)

## 2017-09-02 NOTE — Anesthesia Pain Management Evaluation Note (Signed)
  CRNA Pain Management Visit Note  Patient: Toni Sanchez, 31 y.o., female  "Hello I am a member of the anesthesia team at [facility]. We have an anesthesia team available at all times to provide care throughout the hospital, including epidural management and anesthesia for C-section. I don't know your plan for the delivery whether it a natural birth, water birth, IV sedation, nitrous supplementation, doula or epidural, but we want to meet your pain goals."   1.Was your pain managed to your expectations on prior hospitalizations?   Yes   2.What is your expectation for pain management during this hospitalization?     IV pain meds  3.How can we help you reach that goal? Be available, patient has received pain meds with some relief  Record the patient's initial score and the patient's pain goal.   Pain: 7  Pain Goal: 5 [facility] wants you to be able to say your pain was always managed very well.  [facility] 09/02/2017

## 2017-09-02 NOTE — Lactation Note (Signed)
This note was copied from a baby's chart. Lactation Consultation Note  Patient Name: Toni Sanchez Today's Date: 09/02/2017  Initial visit attempted at 7 hours of life, but Mom was sleeping.   Matthias [facility] 09/02/2017, 11:25 PM

## 2017-09-02 NOTE — Progress Notes (Signed)
OB PN:  S: Pt feeling painful contractions, pain 8/10- just received IV pain medication  O: BP 127/77   Pulse 86   Temp 98.1 F (36.7 C) (Oral)   Resp 20   Ht 5\' 9"  (1.753 m)   Wt 134.7 kg (297 lb)   LMP 11/10/2016 (Exact Date)   BMI 43.86 kg/m   FHT: 135bpm, moderate variablity, + accels, no decels Toco: q1-63min SVE: 45/80/-2, per RN @0645   A/P: 31 y.o. G2P1001 @ [redacted]w[redacted]d for IOL due to gestational HTN 1. FWB: Cat. I 2. Labor: continue Pit per protocol Pain: IV fentanyl currently GBS: neg Gestational HTN: BP within normal limits, will continue to closely monitor   Janyth Pupa, DO [phone] (pager) [phone] (office)

## 2017-09-03 LAB — CBC
HCT: 34 % — ABNORMAL LOW (ref 36.0–46.0)
HEMOGLOBIN: 11.2 g/dL — AB (ref 12.0–15.0)
MCH: 30.1 pg (ref 26.0–34.0)
MCHC: 32.9 g/dL (ref 30.0–36.0)
MCV: 91.4 fL (ref 78.0–100.0)
PLATELETS: 201 10*3/uL (ref 150–400)
RBC: 3.72 MIL/uL — AB (ref 3.87–5.11)
RDW: 14 % (ref 11.5–15.5)
WBC: 9.8 10*3/uL (ref 4.0–10.5)

## 2017-09-03 LAB — BIRTH TISSUE RECOVERY COLLECTION (PLACENTA DONATION)

## 2017-09-03 MED ORDER — IBUPROFEN 600 MG PO TABS: 600.0000 mg | ORAL_TABLET | Freq: Four times a day (QID) | ORAL | 0 refills | Status: AC

## 2017-09-03 NOTE — Discharge Instructions (Signed)

## 2017-09-03 NOTE — Progress Notes (Signed)
Postpartum Note Day # 1  S:  Patient resting comfortable in bed.  Pain controlled.  Tolerating general diet. + flatus, no BM.  Lochia moderate.  Ambulating without difficulty.  She denies n/v/f/c, SOB, or CP.  Pt plans on breastfeeding.  O: Temp:  [97.9 F (36.6 C)-99.1 F (37.3 C)] 99.1 F (37.3 C) (10/18 0555) Pulse Rate:  [61-165] 71 (10/18 0555) Resp:  [16-20] 16 (10/18 0555) BP: (100-141)/(61-82) 118/75 (10/18 0555) Gen: A&Ox3, NAD CV: RRR Resp: CTAB Abdomen: obese, soft, NT, ND Uterus: firm, non-tender, below umbilicus Ext: 1+ edema, no calf tenderness bilaterally, SCDs in place  Labs:  Recent Labs  09/02/17 0115 09/03/17 0523  HGB 11.4* 11.2*    A/P: Pt is a 31 y.o. G2P2002 s/p NSVD, PPD#1  - Pain well controlled -GU: UOP is adequate -GI: Tolerating general diet -Activity: encouraged sitting up to chair and ambulation as tolerated -Prophylaxis: early ambluation -Labs: stable as above  Early discharge, pending baby status/feeding  Janyth Pupa, DO [phone] (pager) [phone] (office)

## 2017-09-03 NOTE — Lactation Note (Signed)
This note was copied from a baby's chart. Lactation Consultation Note; Mother reports that she last fed infant at 11;00 for 5 mins. Infant is in crib cuing. Offered assistance with positioning infant and latching infant.  Mother is wearing inverted nipple shells for semi-flat nipples.   Mother taught to firm nipple before latching and hand express colostrum. Infant placed in football hold and latched to the rt breast. Mother taught to latch infant on with off sided technique.   Infant sustained latch for 30 mins. Mother taught to observed for swallows. Observed good pattern of suckling and  intermittent swallows.  Discussed cluster feeding and feeding skin to skin. Mother to cue base feed and feed at least 8-12 times in 24 hours.   Patient Name: Toni Sanchez [id number] Date: 09/03/2017 Reason for consult: Follow-up assessment   Maternal Data    Feeding Feeding Type: Breast Fed Length of feed: 30 min (consistent suckling.)  LATCH Score Latch: Grasps breast easily, tongue down, lips flanged, rhythmical sucking.  Audible Swallowing: A few with stimulation  Type of Nipple: Everted at rest and after stimulation  Comfort (Breast/Nipple): Soft / non-tender  Hold (Positioning): Assistance needed to correctly position infant at breast and maintain latch.  LATCH Score: 8  Interventions    Lactation Tools Discussed/Used     Consult Status Consult Status: Follow-up Date: 09/03/17 Follow-up type: In-patient    Jess Barters [facility] 09/03/2017, 1:46 PM

## 2017-09-03 NOTE — Lactation Note (Signed)
This note was copied from a baby's chart. Lactation Consultation Note New mom states BF going well. Baby 35 hrs old. Noted colostrum around mouth.  Mom has large pendulous breast. Had flat compressible nipple at bottom end of breast. RN had given mom shells. Mom wearing shells w/nursing bra. Mom demonstrated how she has been BF. Baby needed proper body alignment adjusted, mom taught how to do cross cradle and football hold. Mom holding fingers at nipple. Discussed baby can't get nipples in mouth if finger on them. Taught "C" hold and sand which hold. Baby BF well. Needed flange and lip adjustment.  When mom changing diaper before latching, noted baby crying w/tongue appears to be limited unable to raise to roof of mouth w/slight curl on edges of tongue. Top lip appears slightly tight, needed finger flange on the breast. Hand expression taught w/easy flow of colostrum.  New born behavior, feeding habits, STS, I&O, cluster feeding, supply and demand discussed. Noted pacifier in crib, discouraged for 2 weeks and why explained.  Mom encouraged to feed baby 8-12 times/24 hours and with feeding cues. Encouraged if hasn't cued in 3 hours to feed, stimulate to feed.  [place] brochure given w/resources, support groups and [place] services. Mom signed up for [facility].  Encouraged to call for assistance or questions.  Patient Name: Girl Fama Muenchow Today's Date: 09/03/2017 Reason for consult: Initial assessment   Maternal Data Has patient been taught Hand Expression?: Yes Does the patient have breastfeeding experience prior to this delivery?: No  Feeding Feeding Type: Breast Fed Length of feed: 15 min (still BF)  LATCH Score Latch: Repeated attempts needed to sustain latch, nipple held in mouth throughout feeding, stimulation needed to elicit sucking reflex.  Audible Swallowing: A few with stimulation  Type of Nipple: Flat  Comfort (Breast/Nipple): Soft / non-tender  Hold (Positioning): Assistance needed  to correctly position infant at breast and maintain latch.  LATCH Score: 6  Interventions Interventions: Breast feeding basics reviewed;Breast compression;Assisted with latch;Adjust position;Skin to skin;Support pillows;Breast massage;Position options;Hand express;Shells  Lactation Tools Discussed/Used WIC Program: Yes   Consult Status Consult Status: Follow-up Date: 09/03/17 Follow-up type: In-patient    Delray Reza, Elta [place] 09/03/2017, 1:53 AM

## 2017-09-04 NOTE — Lactation Note (Signed)
This note was copied from a baby's chart. Lactation Consultation Note  Patient Name: Toni Sanchez [id number] Date: 09/04/2017 Reason for consult: Follow-up assessment   Follow up with mom of 64 hour old infant. Infant with 6 BF for 10-35 minutes, 1 BF attempt, formula via bottle x 4 20-40 cc, 4 voids and 2 stools in last 24 hours. LATCH scores 7-8. Infant weight 8 lb 3.9 oz with 5% weight loss since birth.   Mom reports she knows how to BF but infant was having difficulty latching to her nipples last night. She reports her nipples are not sticking out like mom wants them to and she will work on them at home. Mom has shells and is not wearing them currently. Enc her to wear between feeds during the day. Mom has and is using hand pump to evert nipples prior to feedings. Enc mom to use reverse pressure prior to latch also if not wearing shells. Mom reports infant is sleeping better and is having bowel movements since getting formula. She is concerned infant is not getting enough. Enc mom to offer breast prior to formula and to feed all EBM to infant prior to formula. Mom voiced understanding. Reviewed I/O and signs of dehydration with mom.   Mom reports she has a Medela PIS at home. Reviewed supply and demand and milk coming to volume, Reviewed risk of Engorgement with not pumping/feeding, Engorgement prevention/treatment reviewed with mom. Mom reports she will pump when she gets home. Mom reports she knows how to hand express, enc her to hand express post BF.   Offered latching assistance and mom declined saying she feels she knows what to do. Enc mom to call [facility] office with any questions/concerns as needed. Mom aware of OP services and BF Support Groups. Mom reports she has no questions/concerns at this time. Enc mom to call out for latch assistance before d/c if desired.  Report to Pincus Badder, RN.      Maternal Data Has patient been taught Hand Expression?: Yes Does the patient have  breastfeeding experience prior to this delivery?: Yes  Feeding    LATCH Score                   Interventions Interventions: Hand express;Hand pump;Breast massage;Breast feeding basics reviewed;Support pillows;Assisted with latch;Position options;Expressed milk;Skin to skin;Shells  Lactation Tools Discussed/Used WIC Program: Yes Pump Review: Setup, frequency, and cleaning;Milk Storage   Consult Status Consult Status: Complete Follow-up type: Call as needed    Donn Pierini 09/04/2017, 9:11 AM

## 2017-09-04 NOTE — Discharge Summary (Signed)
OB Discharge Summary     Patient Name: Toni Sanchez DOB: [birth year] MRN: [id number]  Date of admission: 09/02/2017 Delivering MD: Janyth Pupa   Date of discharge: 09/04/2017  Admitting diagnosis: INDUCTION Intrauterine pregnancy: [redacted]w[redacted]d     Secondary diagnosis:  Active Problems:   Gestational hypertension  Additional problems: Shoulder dystocia, Obesity     Discharge diagnosis: Term Pregnancy Delivered and Gestational Hypertension                                                                                                Post partum procedures:n/a  Augmentation: Pitocin  Complications: None  Hospital course:  Induction of Labor With Vaginal Delivery   31 y.o. yo G2P2002 at [redacted]w[redacted]d was admitted to the hospital 09/02/2017 for induction of labor.  Indication for induction: Gestational hypertension.  Patient had an uncomplicated labor course as follows: Membrane Rupture Time/Date: 2:50 AM ,09/02/2017   Intrapartum Procedures: Episiotomy: None [1]                                         Lacerations:  None [1]  Patient had delivery of a Viable infant.  Information for the patient's newborn:  Demita, Tobia [[id number]]      09/02/2017  Details of delivery can be found in separate delivery note.  Patient had a routine postpartum course. Patient is discharged home 09/04/17.  Physical exam  Vitals:   09/02/17 1834 09/03/17 0555 09/03/17 1840 09/04/17 0600  BP: 128/82 118/75 137/86 130/83  Pulse: 68 71 67 64  Resp: 18 16 17 16   Temp: 98.6 F (37 C) 99.1 F (37.3 C) 98.2 F (36.8 C) 98.2 F (36.8 C)  TempSrc: Oral Oral Oral Oral  Weight:      Height:       General: alert, cooperative and no distress Lochia: appropriate Uterine Fundus: firm Incision: N/A DVT Evaluation: No evidence of DVT seen on physical exam. Labs: Lab Results  Component Value Date   WBC 9.8 09/03/2017   HGB 11.2 (L) 09/03/2017   HCT 34.0 (L) 09/03/2017   MCV 91.4 09/03/2017   PLT 201  09/03/2017   CMP Latest Ref Rng & Units 09/01/2017  Glucose 65 - 99 mg/dL 123(H)  BUN 6 - 20 mg/dL 7  Creatinine 0.44 - 1.00 mg/dL 0.45  Sodium 135 - 145 mmol/L 139  Potassium 3.5 - 5.1 mmol/L 3.5  Chloride 101 - 111 mmol/L 110  CO2 22 - 32 mmol/L 19(L)  Calcium 8.9 - 10.3 mg/dL 8.6(L)  Total Protein 6.5 - 8.1 g/dL 6.2(L)  Total Bilirubin 0.3 - 1.2 mg/dL 0.6  Alkaline Phos 38 - 126 U/L 221(H)  AST 15 - 41 U/L 26  ALT 14 - 54 U/L 38    Discharge instruction: per After Visit Summary and "Baby and Me Booklet".  After visit meds:  Allergies as of 09/04/2017   No Known Allergies     Medication List    STOP taking these medications   lidocaine 5 %  ointment Commonly known as:  XYLOCAINE     TAKE these medications   acetaminophen 325 MG tablet Commonly known as:  TYLENOL Take 650 mg by mouth every 6 (six) hours as needed for mild pain or headache.   ferrous sulfate 325 (65 FE) MG tablet Take 325 mg by mouth daily with breakfast.   hydrocortisone 2.5 % rectal cream Commonly known as:  ANUSOL-HC Apply rectally 2 times daily   ibuprofen 600 MG tablet Commonly known as:  ADVIL,MOTRIN Take 1 tablet (600 mg total) by mouth every 6 (six) hours.   prenatal multivitamin Tabs tablet Take 1 tablet by mouth daily at 12 noon.       Diet: routine diet  Activity: Advance as tolerated. Pelvic rest for 6 weeks.   Outpatient follow up:6 weeks Follow up Appt:No future appointments. Follow up Visit:No Follow-up on file.  Postpartum contraception: IUD Mirena  Newborn Data: Live born female  Birth Weight: 8 lb 11.2 oz (3945 g) APGAR: 8, 9  Newborn Delivery   Time head delivered:  09/02/2017 09:27:58 Birth date/time:  09/02/2017 09:27:00 Delivery type:  Vaginal, Spontaneous Delivery      Baby Feeding: Breast Disposition:home with mother   09/04/2017 Janyth Pupa, M, DO

## 2018-04-30 ENCOUNTER — Encounter (HOSPITAL_COMMUNITY): Payer: Self-pay | Admitting: Nurse Practitioner

## 2018-04-30 DIAGNOSIS — L0501 Pilonidal cyst with abscess: Secondary | ICD-10-CM | POA: Insufficient documentation

## 2018-04-30 DIAGNOSIS — Z87891 Personal history of nicotine dependence: Secondary | ICD-10-CM | POA: Insufficient documentation

## 2018-04-30 LAB — CBC
HCT: 35.3 % — ABNORMAL LOW (ref 36.0–46.0)
HEMOGLOBIN: 11.2 g/dL — AB (ref 12.0–15.0)
MCH: 29 pg (ref 26.0–34.0)
MCHC: 31.7 g/dL (ref 30.0–36.0)
MCV: 91.5 fL (ref 78.0–100.0)
Platelets: 324 10*3/uL (ref 150–400)
RBC: 3.86 MIL/uL — ABNORMAL LOW (ref 3.87–5.11)
RDW: 13.7 % (ref 11.5–15.5)
WBC: 10.9 10*3/uL — ABNORMAL HIGH (ref 4.0–10.5)

## 2018-04-30 LAB — COMPREHENSIVE METABOLIC PANEL
ALBUMIN: 4.1 g/dL (ref 3.5–5.0)
ALK PHOS: 54 U/L (ref 38–126)
ALT: 13 U/L — AB (ref 14–54)
ANION GAP: 6 (ref 5–15)
AST: 15 U/L (ref 15–41)
BUN: 11 mg/dL (ref 6–20)
CALCIUM: 9.1 mg/dL (ref 8.9–10.3)
CO2: 26 mmol/L (ref 22–32)
Chloride: 108 mmol/L (ref 101–111)
Creatinine, Ser: 0.73 mg/dL (ref 0.44–1.00)
GFR calc Af Amer: >60 mL/min (ref >60)
GFR calc non Af Amer: >60 mL/min (ref >60)
GLUCOSE: 89 mg/dL (ref 65–99)
Potassium: 3.9 mmol/L (ref 3.5–5.1)
SODIUM: 140 mmol/L (ref 135–145)
Total Bilirubin: 0.8 mg/dL (ref 0.3–1.2)
Total Protein: 7.3 g/dL (ref 6.5–8.1)

## 2018-04-30 LAB — I-STAT BETA HCG BLOOD, ED (MC, WL, AP ONLY)

## 2018-04-30 NOTE — ED Triage Notes (Signed)
Pt states she "has a boil on top of her butt crack" by her further and detailed description, sounds similar to a Pilonidal cyst and she reports she first noticed it about 3 days ago and has also been experiencing low grade fever and chills.

## 2018-05-01 ENCOUNTER — Emergency Department (HOSPITAL_COMMUNITY)
Admission: EM | Admit: 2018-05-01 | Discharge: 2018-05-01 | Disposition: A | Discharge status: Home / Self Care | Payer: Medicaid Other | Attending: Emergency Medicine | Admitting: Emergency Medicine

## 2018-05-01 DIAGNOSIS — L0501 Pilonidal cyst with abscess: Secondary | ICD-10-CM

## 2018-05-01 MED ORDER — DOXYCYCLINE HYCLATE 100 MG PO CAPS: 100.0000 mg | ORAL_CAPSULE | Freq: Two times a day (BID) | ORAL | 0 refills | Status: DC

## 2018-05-01 MED ORDER — LIDOCAINE-EPINEPHRINE (PF) 2 %-1:200000 IJ SOLN
10.0000 mL | Freq: Once | INTRAMUSCULAR | Status: AC
  Administered 2018-05-01: 10 mL
  Filled 2018-05-01: qty 20

## 2018-05-01 MED ORDER — HYDROCODONE-ACETAMINOPHEN 5-325 MG PO TABS: 1.0000 | ORAL_TABLET | Freq: Four times a day (QID) | ORAL | 0 refills | Status: DC | PRN

## 2018-05-01 NOTE — Discharge Instructions (Addendum)
Do NOT breast feed while taking the medications prescribed today.  If you are breastfeeding, you will need to pump and waste.

## 2018-05-01 NOTE — ED Provider Notes (Signed)
[facility] DEPT Provider Note   CSN: [id number] Arrival date & time: 04/30/18  2111     History   Chief Complaint Chief Complaint  Patient presents with  . Pilonidal Cyst    HPI Toni Sanchez is a 32 y.o. female.  Patient presents to the emergency department with a chief complaint of abscess.  She states that she noticed a bump on her backside about a week ago.  She reports that it has gradually worsened in size and in pain.  She denies any fevers or chills.  She has not taken anything for the symptoms.  She has never had this problem before.  She denies any other associated symptoms.  It is worsened with palpation, sitting, and ambulation.  The history is provided by the patient. No language interpreter was used.    Past Medical History:  Diagnosis Date  . Medical history non-contributory     Patient Active Problem List   Diagnosis Date Noted  . Gestational hypertension 09/02/2017  . Other and unspecified ovarian cyst 05/08/2014    Past Surgical History:  Procedure Laterality Date  . CHOLECYSTECTOMY  2010  . LAPAROSCOPIC OVARIAN CYSTECTOMY Left 11/26/2016   Procedure: LAPAROSCOPIC OVARIAN CYSTECTOMY;  Surgeon: Janyth Pupa, DO;  Location: [facility];  Service: Gynecology;  Laterality: Left;     OB History    Gravida  2   Para  2   Term  2   Preterm      AB      Living  2     SAB      TAB      Ectopic      Multiple  0   Live Births  2            Home Medications    Prior to Admission medications   Medication Sig Start Date End Date Taking? Authorizing Provider  acetaminophen (TYLENOL) 325 MG tablet Take 650 mg by mouth every 6 (six) hours as needed for mild pain or headache.    [provider]  ferrous sulfate 325 (65 FE) MG tablet Take 325 mg by mouth daily with breakfast.    [provider]  hydrocortisone (ANUSOL-HC) 2.5 % rectal cream Apply rectally 2 times daily 08/08/17   Poe, Deirdre C,  CNM  ibuprofen (ADVIL,MOTRIN) 600 MG tablet Take 1 tablet (600 mg total) by mouth every 6 (six) hours. 09/03/17   Janyth Pupa, DO  Prenatal Vit-Fe Fumarate-FA (PRENATAL MULTIVITAMIN) TABS tablet Take 1 tablet by mouth daily at 12 noon.    [provider]    Family History Family History  Problem Relation Age of Onset  . Hypertension Mother   . Diabetes Mother   . Hypertension Father     Social History Social History   Tobacco Use  . Smoking status: Former Smoker    Packs/day: 0.50    Years: 2.00    Pack years: 1.00    Types: Cigarettes    Last attempt to quit: 11/30/2016    Years since quitting: 1.4  . Smokeless tobacco: Never Used  Substance Use Topics  . Alcohol use: No  . Drug use: No    Comment: daily     Allergies   Patient has no known allergies.   Review of Systems Review of Systems  All other systems reviewed and are negative.    Physical Exam Updated Vital Signs BP (!) 129/114 (BP Location: Left Arm)   Pulse 91   Temp  99.7 F (37.6 C) (Oral)   Resp 14   SpO2 100%   Physical Exam  Constitutional: She is oriented to person, place, and time. No distress.  HENT:  Head: Normocephalic and atraumatic.  Eyes: Pupils are equal, round, and reactive to light. Conjunctivae and EOM are normal.  Neck: No tracheal deviation present.  Cardiovascular: Normal rate.  Pulmonary/Chest: Effort normal. No respiratory distress.  Abdominal: Soft.  Musculoskeletal: Normal range of motion.  Neurological: She is alert and oriented to person, place, and time.  Skin: Skin is warm and dry. She is not diaphoretic.  3 x 4 cm pilonidal abscess with surrounding induration  Psychiatric: Judgment normal.  Nursing note and vitals reviewed.    ED Treatments / Results  Labs (all labs ordered are listed, but only abnormal results are displayed) Labs Reviewed  COMPREHENSIVE METABOLIC PANEL - Abnormal; Notable for the following components:      Result Value   ALT  13 (*)    All other components within normal limits  CBC - Abnormal; Notable for the following components:   WBC 10.9 (*)    RBC 3.86 (*)    Hemoglobin 11.2 (*)    HCT 35.3 (*)    All other components within normal limits  I-STAT BETA HCG BLOOD, ED ([facility], [facility], [facility] ONLY)    EKG None  Radiology No results found.  Procedures Procedures (including critical care time) INCISION AND DRAINAGE Performed by: Montine Circle Consent: Verbal consent obtained. Risks and benefits: risks, benefits and alternatives were discussed Type: abscess  Body area: pilonidal  Anesthesia: local infiltration  Incision was made with a scalpel.  Local anesthetic: lidocaine 1% with epinephrine  Anesthetic total: 8 ml  Complexity: complex Blunt dissection to break up loculations  Drainage: purulent  Drainage amount: copious  Packing material: 1 in iodoform gauze  Patient tolerance: Patient tolerated the procedure well with no immediate complications.    Medications Ordered in ED Medications  lidocaine-EPINEPHrine (XYLOCAINE W/EPI) 2 %-1:200000 (PF) injection 10 mL (10 mLs Infiltration Given by Other 05/01/18 0115)     Initial Impression / Assessment and Plan / ED Course  I have reviewed the triage vital signs and the nursing notes.  Pertinent labs & imaging results that were available during my care of the patient were reviewed by me and considered in my medical decision making (see chart for details).     Patient with skin abscess amenable to incision and drainage.   Encouraged home warm soaks and flushing.  Mild signs of cellulitis is surrounding skin.  Will d/c to home.    Final Clinical Impressions(s) / ED Diagnoses   Final diagnoses:  Pilonidal abscess    ED Discharge Orders        Ordered    doxycycline (VIBRAMYCIN) 100 MG capsule  2 times daily     05/01/18 0145    HYDROcodone-acetaminophen (NORCO/VICODIN) 5-325 MG tablet  Every 6 hours PRN     05/01/18 0145         Montine Circle, PA-C 05/01/18 0146    Betsey Holiday Gwenyth Allegra, MD 05/01/18 [phone]

## 2018-11-17 HISTORY — PX: INDUCED ABORTION: SHX677

## 2020-08-13 ENCOUNTER — Ambulatory Visit (INDEPENDENT_AMBULATORY_CARE_PROVIDER_SITE_OTHER): Payer: 59 | Admitting: Family Medicine

## 2020-08-13 ENCOUNTER — Encounter (INDEPENDENT_AMBULATORY_CARE_PROVIDER_SITE_OTHER): Payer: Self-pay | Admitting: Family Medicine

## 2020-08-13 ENCOUNTER — Other Ambulatory Visit: Payer: Self-pay

## 2020-08-13 VITALS — BP 119/81 | HR 68 | Temp 98.3°F | Ht 69.0 in | Wt 242.0 lb

## 2020-08-13 DIAGNOSIS — Z0289 Encounter for other administrative examinations: Secondary | ICD-10-CM

## 2020-08-13 DIAGNOSIS — R0602 Shortness of breath: Secondary | ICD-10-CM | POA: Diagnosis not present

## 2020-08-13 DIAGNOSIS — R5383 Other fatigue: Secondary | ICD-10-CM | POA: Diagnosis not present

## 2020-08-13 DIAGNOSIS — Z1331 Encounter for screening for depression: Secondary | ICD-10-CM | POA: Diagnosis not present

## 2020-08-13 DIAGNOSIS — I10 Essential (primary) hypertension: Secondary | ICD-10-CM

## 2020-08-13 DIAGNOSIS — E559 Vitamin D deficiency, unspecified: Secondary | ICD-10-CM

## 2020-08-13 DIAGNOSIS — Z6835 Body mass index (BMI) 35.0-35.9, adult: Secondary | ICD-10-CM

## 2020-08-14 LAB — CBC WITH DIFFERENTIAL/PLATELET
Basophils Absolute: 0.1 10*3/uL (ref 0.0–0.2)
Basos: 1 %
EOS (ABSOLUTE): 0.1 10*3/uL (ref 0.0–0.4)
Eos: 1 %
Hematocrit: 39.9 % (ref 34.0–46.6)
Hemoglobin: 13 g/dL (ref 11.1–15.9)
Immature Grans (Abs): 0 10*3/uL (ref 0.0–0.1)
Immature Granulocytes: 1 %
Lymphocytes Absolute: 2.1 10*3/uL (ref 0.7–3.1)
Lymphs: 26 %
MCH: 30.2 pg (ref 26.6–33.0)
MCHC: 32.6 g/dL (ref 31.5–35.7)
MCV: 93 fL (ref 79–97)
Monocytes Absolute: 0.4 10*3/uL (ref 0.1–0.9)
Monocytes: 5 %
Neutrophils Absolute: 5.5 10*3/uL (ref 1.4–7.0)
Neutrophils: 66 %
Platelets: 308 10*3/uL (ref 150–450)
RBC: 4.31 x10E6/uL (ref 3.77–5.28)
RDW: 11.6 % — ABNORMAL LOW (ref 11.7–15.4)
WBC: 8.2 10*3/uL (ref 3.4–10.8)

## 2020-08-14 LAB — COMPREHENSIVE METABOLIC PANEL
ALT: 9 IU/L (ref 0–32)
AST: 13 IU/L (ref 0–40)
Albumin/Globulin Ratio: 2.2 (ref 1.2–2.2)
Albumin: 4.8 g/dL (ref 3.8–4.8)
Alkaline Phosphatase: 59 IU/L (ref 44–121)
BUN/Creatinine Ratio: 14 (ref 9–23)
BUN: 11 mg/dL (ref 6–20)
Bilirubin Total: 0.4 mg/dL (ref 0.0–1.2)
CO2: 24 mmol/L (ref 20–29)
Calcium: 9.9 mg/dL (ref 8.7–10.2)
Chloride: 101 mmol/L (ref 96–106)
Creatinine, Ser: 0.81 mg/dL (ref 0.57–1.00)
GFR calc Af Amer: 111 mL/min/{1.73_m2} (ref >59)
GFR calc non Af Amer: 96 mL/min/{1.73_m2} (ref >59)
Globulin, Total: 2.2 g/dL (ref 1.5–4.5)
Glucose: 88 mg/dL (ref 65–99)
Potassium: 4.4 mmol/L (ref 3.5–5.2)
Sodium: 138 mmol/L (ref 134–144)
Total Protein: 7 g/dL (ref 6.0–8.5)

## 2020-08-14 LAB — T3: T3, Total: 112 ng/dL (ref 71–180)

## 2020-08-14 LAB — HEMOGLOBIN A1C
Est. average glucose Bld gHb Est-mCnc: 100 mg/dL
Hgb A1c MFr Bld: 5.1 % (ref 4.8–5.6)

## 2020-08-14 LAB — T4: T4, Total: 7.7 ug/dL (ref 4.5–12.0)

## 2020-08-14 LAB — TSH: TSH: 0.417 u[IU]/mL — ABNORMAL LOW (ref 0.450–4.500)

## 2020-08-14 LAB — VITAMIN D 25 HYDROXY (VIT D DEFICIENCY, FRACTURES): Vit D, 25-Hydroxy: 19.8 ng/mL — ABNORMAL LOW (ref 30.0–100.0)

## 2020-08-14 LAB — INSULIN, RANDOM: INSULIN: 9.8 u[IU]/mL (ref 2.6–24.9)

## 2020-08-15 NOTE — Progress Notes (Signed)
Dear Lennie Odor, PA-C,   Thank you for referring Toni Sanchez to our clinic. The following note includes my evaluation and treatment recommendations.  Chief Complaint:   OBESITY Toni Sanchez (MR# [id number]) is a 34 y.o. female who presents for evaluation and treatment of obesity and related comorbidities. Current BMI is Body mass index is 35.74 kg/m. Toni Sanchez has been struggling with her weight for many years and has been unsuccessful in either losing weight, maintaining weight loss, or reaching her healthy weight goal.  Toni Sanchez is currently in the action stage of change and ready to dedicate time achieving and maintaining a healthier weight. Toni Sanchez is interested in becoming our patient and working on intensive lifestyle modifications including (but not limited to) diet and exercise for weight loss.  Toni Sanchez's habits were reviewed today and are as follows: Her family eats meals together, her desired weight loss is 62 lbs, she has been heavy most of her life, her heaviest weight ever was 250 pounds, she has significant food cravings issues, she snacks frequently in the evenings, she skips meals frequently, she is frequently drinking liquids with calories, she frequently makes poor food choices, she has problems with excessive hunger, she frequently eats larger portions than normal and she struggles with emotional eating.  Depression Screen Toni Sanchez's Food and Mood (modified PHQ-9) score was 9.  Depression screen [facility] 2/9 08/13/2020  Decreased Interest 0  Down, Depressed, Hopeless 2  PHQ - 2 Score 2  Altered sleeping 0  Tired, decreased energy 3  Change in appetite 1  Feeling bad or failure about yourself  0  Trouble concentrating 3  Moving slowly or fidgety/restless 0  Suicidal thoughts 0  PHQ-9 Score 9  Difficult doing work/chores Not difficult at all   Subjective:   1. Other fatigue Toni Sanchez admits to daytime somnolence and admits to waking up still tired. Patent has a history of  symptoms of daytime fatigue. Toni Sanchez generally gets 5 or 6 hours of sleep per night, and states that she has nightime awakenings. Snoring is present. Apneic episodes are present. Epworth Sleepiness Score is 2. EKG-normal sinus rhythm at 74 BPM, Rt atrial enlargement seen in VI.  2. SOB (shortness of breath) on exertion Toni Sanchez notes increasing shortness of breath with exercising and seems to be worsening over time with weight gain. She notes getting out of breath sooner with activity than she used to. This has not gotten worse recently. Toni Sanchez denies shortness of breath at rest or orthopnea.  3. Hypertension, unspecified type Toni Sanchez had blood pressure issues during pregnancy, and she had to be on medication.  4. Vitamin D deficiency Toni Sanchez's diagnosis is likely given obesity, and she notes fatigue.  Assessment/Plan:   1. Other fatigue Toni Sanchez does feel that her weight is causing her energy to be lower than it should be. Fatigue may be related to obesity, depression or many other causes. Labs will be ordered, and in the meanwhile, Toni Sanchez will focus on self care including making healthy food choices, increasing physical activity and focusing on stress reduction.  - EKG 12-Lead - Hemoglobin A1c - Insulin, random - T3 - T4 - TSH  2. SOB (shortness of breath) on exertion Toni Sanchez does feel that she gets out of breath more easily that she used to when she exercises. Toni Sanchez's shortness of breath appears to be obesity related and exercise induced. She has agreed to work on weight loss and gradually increase exercise to treat her exercise induced shortness of breath. Will  continue to monitor closely.  - CBC with Differential/Platelet  3. Hypertension, unspecified type Toni Sanchez is working on healthy weight loss and exercise to improve blood pressure control. We will watch for signs of hypotension as she continues her lifestyle modifications. EKG was done, and we will check labs today.  - Comprehensive metabolic  panel  4. Vitamin D deficiency Low Vitamin D level contributes to fatigue and are associated with obesity, breast, and colon cancer. We will check labs today. Toni Sanchez will follow-up for routine testing of Vitamin D, at least 2-3 times per year to avoid over-replacement.  - VITAMIN D 25 Hydroxy (Vit-D Deficiency, Fractures)  5. Depression screening Toni Sanchez had a positive depression screening. Depression is commonly associated with obesity and often results in emotional eating behaviors. We will monitor this closely and work on CBT to help improve the non-hunger eating patterns. Referral to Psychology may be required if no improvement is seen as she continues in our clinic.  6. Class 2 severe obesity with serious comorbidity and body mass index (BMI) of 35.0 to 35.9 in adult, unspecified obesity type (HCC) Toni Sanchez is currently in the action stage of change and her goal is to continue with weight loss efforts. I recommend Toni Sanchez begin the structured treatment plan as follows:  She has agreed to the Category 3 Plan.  Exercise goals: All adults should avoid inactivity. Some physical activity is better than none, and adults who participate in any amount of physical activity gain some health benefits.   Behavioral modification strategies: increasing lean protein intake, increasing vegetables, meal planning and cooking strategies, keeping healthy foods in the home and planning for success.  She was informed of the importance of frequent follow-up visits to maximize her success with intensive lifestyle modifications for her multiple health conditions. She was informed we would discuss her lab results at her next visit unless there is a critical issue that needs to be addressed sooner. Toni Sanchez agreed to keep her next visit at the agreed upon time to discuss these results.  Objective:   Blood pressure 119/81, pulse 68, temperature 98.3 F (36.8 C), temperature source Oral, height 5\' 9"  (1.753 m), weight 242 lb  (109.8 kg), last menstrual period 08/03/2020, SpO2 98 %, unknown if currently breastfeeding. Body mass index is 35.74 kg/m.  EKG: Normal sinus rhythm, rate 74 BPM.  Indirect Calorimeter completed today shows a VO2 of 276 and a REE of 1923.  Her calculated basal metabolic rate is 5732 thus her basal metabolic rate is worse than expected.  General: Cooperative, alert, well developed, in no acute distress. HEENT: Conjunctivae and lids unremarkable. Cardiovascular: Regular rhythm.  Lungs: Normal work of breathing. Neurologic: No focal deficits.   Lab Results  Component Value Date   CREATININE 0.81 08/13/2020   BUN 11 08/13/2020   NA 138 08/13/2020   K 4.4 08/13/2020   CL 101 08/13/2020   CO2 24 08/13/2020   Lab Results  Component Value Date   ALT 9 08/13/2020   AST 13 08/13/2020   ALKPHOS 59 08/13/2020   BILITOT 0.4 08/13/2020   Lab Results  Component Value Date   HGBA1C 5.1 08/13/2020   Lab Results  Component Value Date   INSULIN 9.8 08/13/2020   Lab Results  Component Value Date   TSH 0.417 (L) 08/13/2020   No results found for: CHOL, HDL, LDLCALC, LDLDIRECT, TRIG, CHOLHDL Lab Results  Component Value Date   WBC 8.2 08/13/2020   HGB 13.0 08/13/2020   HCT 39.9 08/13/2020  MCV 93 08/13/2020   PLT 308 08/13/2020   No results found for: IRON, TIBC, FERRITIN  Attestation Statements:   Reviewed by clinician on day of visit: allergies, medications, problem list, medical history, surgical history, family history, social history, and previous encounter notes.  Time spent on visit including pre-visit chart review and post-visit charting and care was 55 minutes.    I, Trixie Dredge, am acting as transcriptionist for Coralie Common, MD.  This is the patient's first visit at [facility]. The patient's NEW PATIENT PACKET was reviewed at length. Included in the packet: current and past health history, medications, allergies, ROS, gynecologic history  (women only), surgical history, family history, social history, weight history, weight loss surgery history (for those that have had weight loss surgery), nutritional evaluation, mood and food questionnaire, PHQ9, Epworth questionnaire, sleep habits questionnaire, patient life and health improvement goals questionnaire. These will all be scanned into the patient's chart under media.   During the visit, I independently reviewed the patient's EKG, bioimpedance scale results, and indirect calorimeter results. I used this information to tailor a meal plan for the patient that will help her to lose weight and will improve her obesity-related conditions going forward. I performed a medically necessary appropriate examination and/or evaluation. I discussed the assessment and treatment plan with the patient. The patient was provided an opportunity to ask questions and all were answered. The patient agreed with the plan and demonstrated an understanding of the instructions. Labs were ordered at this visit and will be reviewed at the next visit unless more critical results need to be addressed immediately. Clinical information was updated and documented in the EMR.   Time spent on visit including pre-visit chart review and post-visit care was 45 minutes.   A separate 15 minutes was spent on risk counseling (see above).

## 2020-08-27 ENCOUNTER — Encounter (INDEPENDENT_AMBULATORY_CARE_PROVIDER_SITE_OTHER): Payer: Self-pay | Admitting: Family Medicine

## 2020-08-27 ENCOUNTER — Ambulatory Visit (INDEPENDENT_AMBULATORY_CARE_PROVIDER_SITE_OTHER): Payer: 59 | Admitting: Family Medicine

## 2020-08-27 ENCOUNTER — Other Ambulatory Visit: Payer: Self-pay

## 2020-08-27 VITALS — BP 125/81 | HR 77 | Temp 98.4°F | Ht 69.0 in | Wt 242.0 lb

## 2020-08-27 DIAGNOSIS — R7989 Other specified abnormal findings of blood chemistry: Secondary | ICD-10-CM | POA: Diagnosis not present

## 2020-08-27 DIAGNOSIS — Z9189 Other specified personal risk factors, not elsewhere classified: Secondary | ICD-10-CM

## 2020-08-27 DIAGNOSIS — E8881 Metabolic syndrome: Secondary | ICD-10-CM | POA: Diagnosis not present

## 2020-08-27 DIAGNOSIS — E559 Vitamin D deficiency, unspecified: Secondary | ICD-10-CM

## 2020-08-27 DIAGNOSIS — Z6835 Body mass index (BMI) 35.0-35.9, adult: Secondary | ICD-10-CM

## 2020-08-27 MED ORDER — VITAMIN D (ERGOCALCIFEROL) 1.25 MG (50000 UNIT) PO CAPS: 50000.0000 [IU] | ORAL_CAPSULE | ORAL | 0 refills | Status: AC

## 2020-08-27 NOTE — Progress Notes (Signed)
Chief Complaint:   OBESITY Toni Sanchez is here to discuss her progress with her obesity treatment plan along with follow-up of her obesity related diagnoses. Toni Sanchez is on the Category 3 Plan and states she is following her eating plan approximately 50% of the time. Toni Sanchez states she is doing HIT for 30-40 minutes 3 times per week.  Today's visit was #: 2 Starting weight: 242 lbs Starting date: 08/13/2020 Today's weight: 242 lbs Today's date: 08/27/2020 Total lbs lost to date: 0 Total lbs lost since last in-office visit: 0  Interim History: Toni Sanchez had a hard time weighing out meat consistently. She did have some indulgent eating. She ate some snack type food that she thinks she overate. She did eggs frequent for breakfast, for lunch she did a few microwave meals otherwise did a sandwich. For dinner she has a few takeout excursions and was mindful. For snacks she did skinny pop and nut thins.  Subjective:   1. Vitamin D deficiency Toni Sanchez is not on Vit D supplementations, and she notes fatigue. Last vit D level was 19. I discussed labs with the patient today.  2. Insulin resistance Toni Sanchez's last A1c was 5.1 and insulin 9.8. She is experiencing some drive for carbohydrate rich snacks.   3. Abnormal TSH Toni Sanchez's TSH was 0.417, and T3 and T4 within normal limits. She denies a history of abnormal thyroid.  4. At risk for diabetes mellitus Toni Sanchez is at higher than average risk for developing diabetes due to her obesity.   Assessment/Plan:   1. Vitamin D deficiency Low Vitamin D level contributes to fatigue and are associated with obesity, breast, and colon cancer. Welda agreed to start prescription Vitamin D 50,000 IU every week with no refills. She will follow-up for routine testing of Vitamin D, at least 2-3 times per year to avoid over-replacement.  - Vitamin D, Ergocalciferol, (DRISDOL) 1.25 MG (50000 UNIT) CAPS capsule; Take 1 capsule (50,000 Units total) by mouth every 7 (seven) days.   Dispense: 4 capsule; Refill: 0  2. Insulin resistance Toni Sanchez will continue to work on weight loss, exercise, and decreasing simple carbohydrates to help decrease the risk of diabetes. We will repeat labs in 3 months. Toni Sanchez agreed to follow-up with [place] as directed to closely monitor her progress.  3. Abnormal TSH We will repeat TSH in 1.5 months. Orders and follow up as documented in patient record.  Counseling . Good thyroid control is important for overall health. Supratherapeutic thyroid levels are dangerous and will not improve weight loss results. . Counseling: The correct way to take levothyroxine is fasting, with water, separated by at least 30 minutes from breakfast, and separated by more than 4 hours from calcium, iron, multivitamins, acid reflux medications (PPIs).    4. At risk for diabetes mellitus Toni Sanchez was given approximately 15 minutes of diabetes education and counseling today. We discussed intensive lifestyle modifications today with an emphasis on weight loss as well as increasing exercise and decreasing simple carbohydrates in her diet. We also reviewed medication options with an emphasis on risk versus benefit of those discussed.   Repetitive spaced learning was employed today to elicit superior memory formation and behavioral change.  5. Class 2 severe obesity with serious comorbidity and body mass index (BMI) of 35.0 to 35.9 in adult, unspecified obesity type (HCC) Toni Sanchez is currently in the action stage of change. As such, her goal is to continue with weight loss efforts. She has agreed to the Category 3 Plan.   Exercise  goals: As is.  Behavioral modification strategies: increasing lean protein intake, increasing vegetables, meal planning and cooking strategies, keeping healthy foods in the home and planning for success.  Toni Sanchez has agreed to follow-up with our clinic in 2 weeks. She was informed of the importance of frequent follow-up visits to maximize her success with  intensive lifestyle modifications for her multiple health conditions.   Objective:   Blood pressure 125/81, pulse 77, temperature 98.4 F (36.9 C), temperature source Oral, height 5\' 9"  (1.753 m), weight 242 lb (109.8 kg), last menstrual period 08/03/2020, SpO2 98 %, unknown if currently breastfeeding. Body mass index is 35.74 kg/m.  General: Cooperative, alert, well developed, in no acute distress. HEENT: Conjunctivae and lids unremarkable. Cardiovascular: Regular rhythm.  Lungs: Normal work of breathing. Neurologic: No focal deficits.   Lab Results  Component Value Date   CREATININE 0.81 08/13/2020   BUN 11 08/13/2020   NA 138 08/13/2020   K 4.4 08/13/2020   CL 101 08/13/2020   CO2 24 08/13/2020   Lab Results  Component Value Date   ALT 9 08/13/2020   AST 13 08/13/2020   ALKPHOS 59 08/13/2020   BILITOT 0.4 08/13/2020   Lab Results  Component Value Date   HGBA1C 5.1 08/13/2020   Lab Results  Component Value Date   INSULIN 9.8 08/13/2020   Lab Results  Component Value Date   TSH 0.417 (L) 08/13/2020   No results found for: CHOL, HDL, LDLCALC, LDLDIRECT, TRIG, CHOLHDL Lab Results  Component Value Date   WBC 8.2 08/13/2020   HGB 13.0 08/13/2020   HCT 39.9 08/13/2020   MCV 93 08/13/2020   PLT 308 08/13/2020   No results found for: IRON, TIBC, FERRITIN  Attestation Statements:   Reviewed by clinician on day of visit: allergies, medications, problem list, medical history, surgical history, family history, social history, and previous encounter notes.   I, Trixie Dredge, am acting as transcriptionist for Coralie Common, MD.  I have reviewed the above documentation for accuracy and completeness, and I agree with the above. - Jinny Blossom, MD

## 2020-09-10 ENCOUNTER — Ambulatory Visit (INDEPENDENT_AMBULATORY_CARE_PROVIDER_SITE_OTHER): Payer: 59 | Admitting: Adult Health

## 2020-09-11 ENCOUNTER — Encounter (INDEPENDENT_AMBULATORY_CARE_PROVIDER_SITE_OTHER): Payer: Self-pay

## 2020-09-11 ENCOUNTER — Ambulatory Visit (INDEPENDENT_AMBULATORY_CARE_PROVIDER_SITE_OTHER): Payer: 59 | Admitting: Family Medicine

## 2020-10-01 ENCOUNTER — Other Ambulatory Visit (INDEPENDENT_AMBULATORY_CARE_PROVIDER_SITE_OTHER): Payer: Self-pay | Admitting: Family Medicine

## 2020-10-01 DIAGNOSIS — E559 Vitamin D deficiency, unspecified: Secondary | ICD-10-CM

## 2020-10-01 NOTE — Telephone Encounter (Signed)
last seen by Dr.Ukleja

## 2021-06-10 ENCOUNTER — Other Ambulatory Visit: Payer: Self-pay | Admitting: Family Medicine

## 2022-02-10 ENCOUNTER — Other Ambulatory Visit: Payer: Self-pay | Admitting: Family Medicine

## 2022-02-10 DIAGNOSIS — M21921 Unspecified acquired deformity of right upper arm: Secondary | ICD-10-CM

## 2022-02-11 ENCOUNTER — Other Ambulatory Visit: Payer: Self-pay | Admitting: Family Medicine

## 2022-02-11 DIAGNOSIS — M21921 Unspecified acquired deformity of right upper arm: Secondary | ICD-10-CM

## 2022-02-20 ENCOUNTER — Ambulatory Visit
Admission: RE | Admit: 2022-02-20 | Discharge: 2022-02-20 | Disposition: A | Discharge status: Home / Self Care | Payer: 59 | Source: Ambulatory Visit | Attending: Family Medicine | Admitting: Family Medicine

## 2022-02-20 DIAGNOSIS — M21921 Unspecified acquired deformity of right upper arm: Secondary | ICD-10-CM

## 2022-06-25 ENCOUNTER — Encounter (INDEPENDENT_AMBULATORY_CARE_PROVIDER_SITE_OTHER): Payer: Self-pay

## 2022-08-29 IMAGING — CT CT ELBOW*R* W/O CM
3 of 6 series · 12 of 36 positions shown, 14 images · non-contrast
Comparison: None.

CLINICAL DATA: Acquired deformity of right elbow, pain, unable to
straighten arm

EXAM:
CT OF THE UPPER RIGHT EXTREMITY WITHOUT CONTRAST
3-DIMENSIONAL CT IMAGE RENDERING ON ACQUISITION WORKSTATION
TECHNIQUE: Multidetector CT imaging of the upper right extremity was performed
according to the standard protocol.

[Series 3: elbow 1.50 br60 s3 axial bone hd fov · axial · 0.23mm/px · z∈[-817,-688]mm · 5 of 243 slices shown, 7 images]
[im 41/243  soft-tissue]
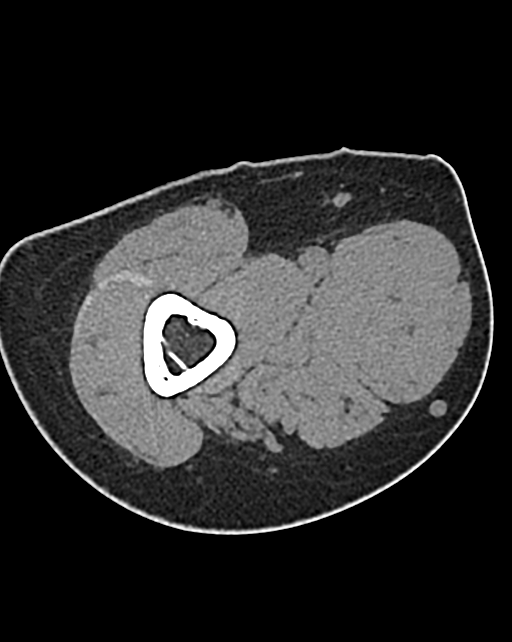
[im 41/243  bone]
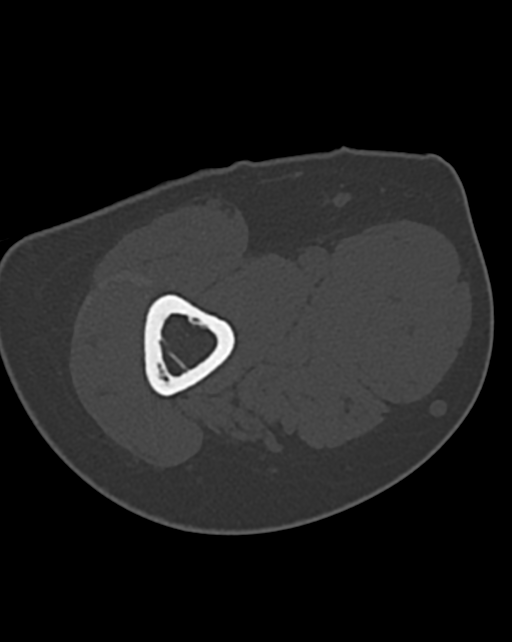
[im 81/243  bone]
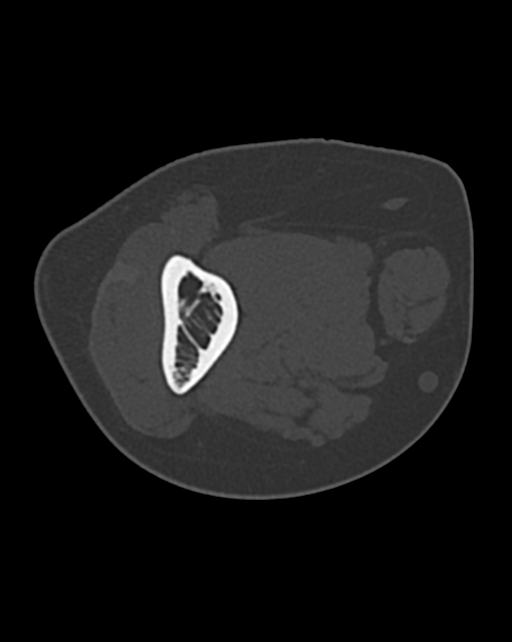
[im 122/243  bone]
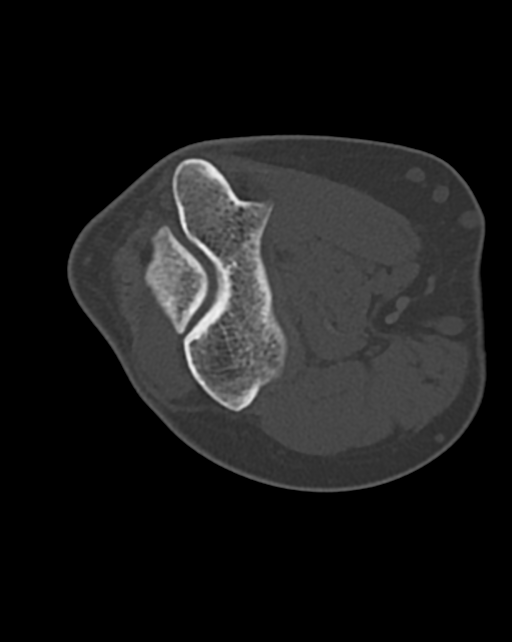
[im 162/243  bone]
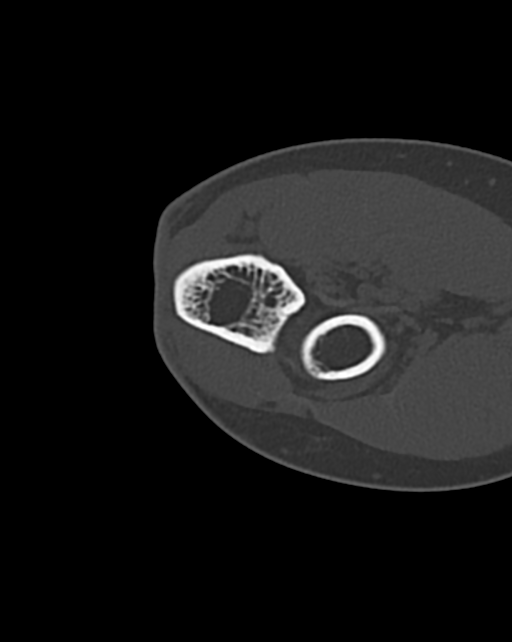
[im 202/243  soft-tissue]
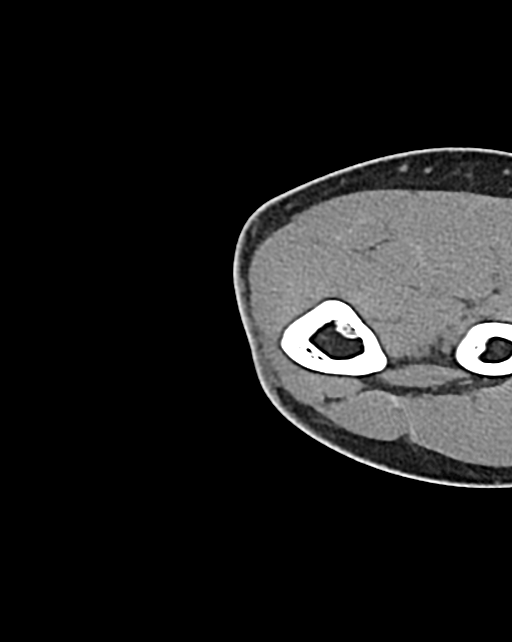
[im 202/243  bone]
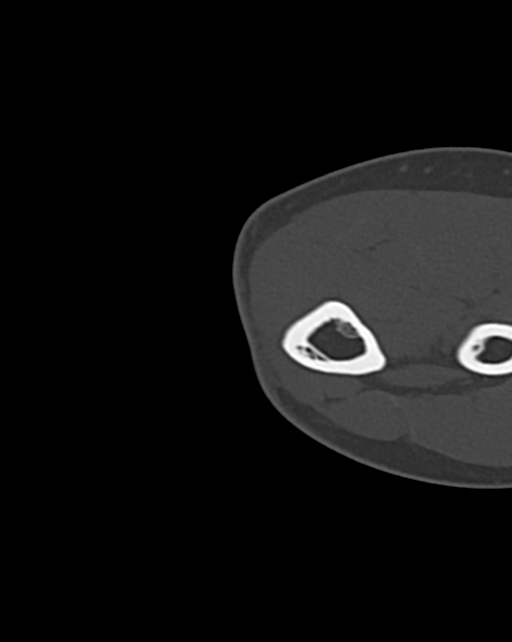

[Series 9: elbow 1.50 hr60 s3 cor bone hd fov · sagittal · 0.24mm/px · 6 of 141 slices shown]
[im 24/141  bone]
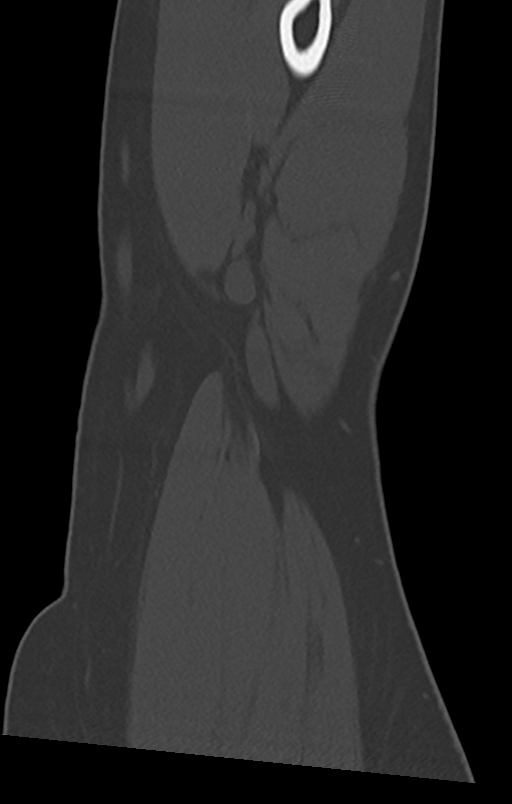
[im 47/141  bone]
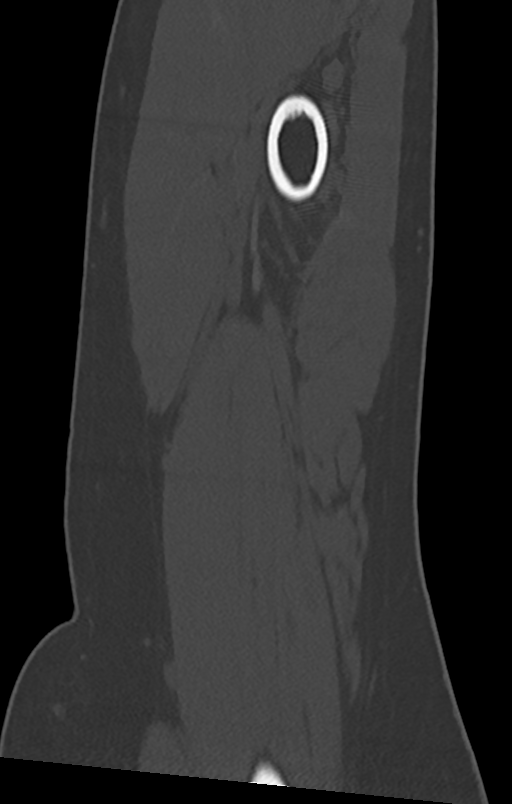
[im 49/141  soft-tissue]
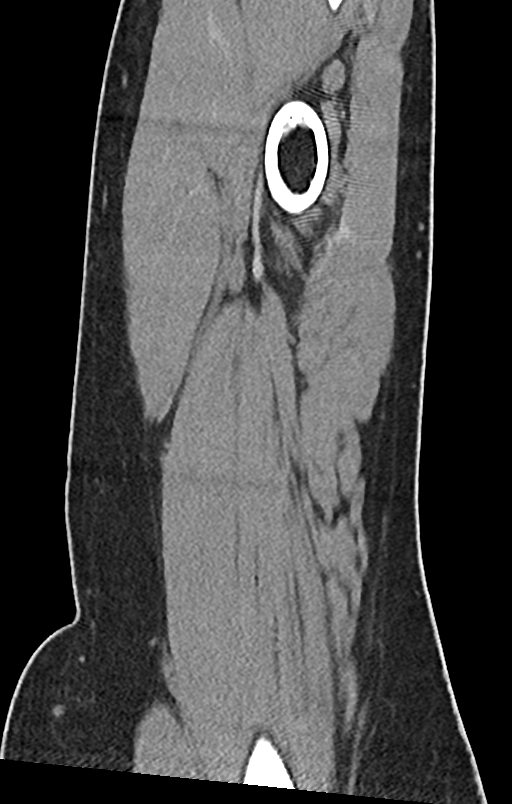
[im 71/141  bone]
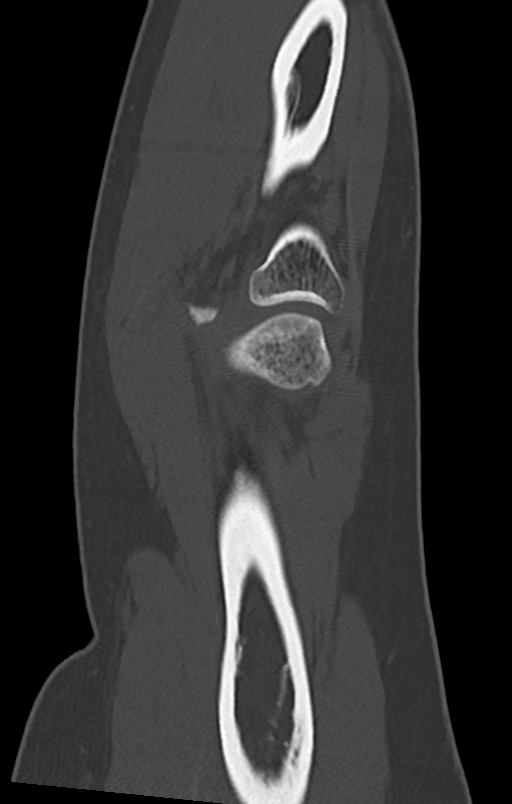
[im 94/141  bone]
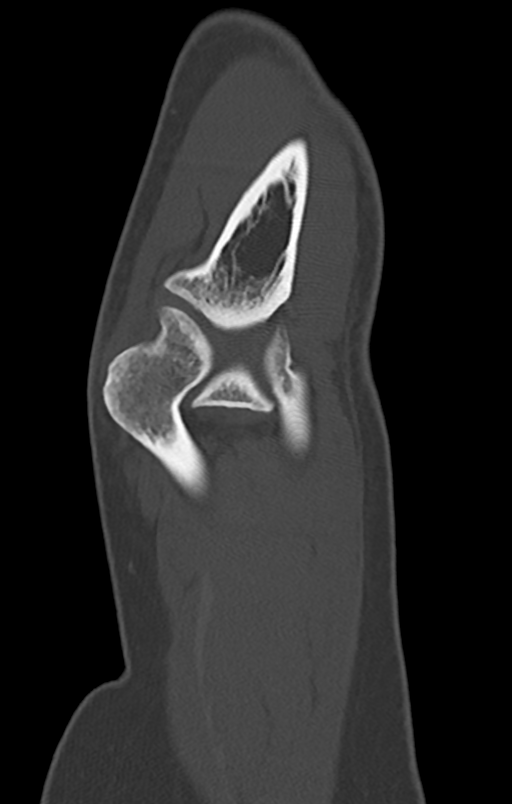
[im 117/141  bone]
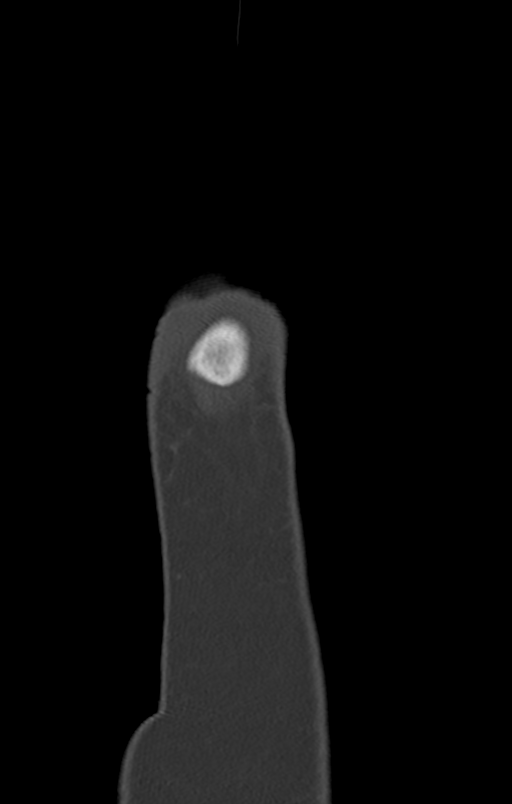

[Series 13: elbow 1.50 br60 s3 sag bone hd fov · coronal · 0.33mm/px · 1 of 155 slices shown]
[im 78/155  bone]
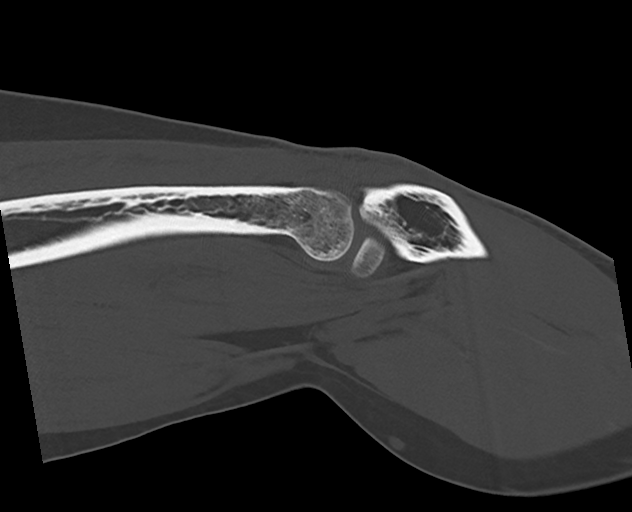

[12 of 36 positions shown; findings below may reference images not displayed]

RADIATION DOSE REDUCTION: This exam was performed according to the
departmental dose-optimization program which includes automated
exposure control, adjustment of the mA and/or kV according to
patient size and/or use of iterative reconstruction technique.

3-dimensional CT images were rendered by post-processing of the
original CT data on an acquisition workstation. The 3-dimensional CT
images were interpreted and findings were reported in the
accompanying complete CT report for this study
FINDINGS: Bones/Joint/Cartilage

There is no evidence of acute fracture. Alignment is normal. No
significant arthropathy.

Ligaments

Suboptimally assessed by CT.

Muscles and Tendons

No acute myotendinous abnormality by CT.

Soft tissues

No focal fluid collection.
IMPRESSION: Normal right elbow CT.

## 2022-08-29 IMAGING — CT CT 3D INDEPENDENT WKST
1 series · 1 of 3 positions shown · non-contrast
Comparison: None.

CLINICAL DATA: Acquired deformity of right elbow, pain, unable to
straighten arm

EXAM:
CT OF THE UPPER RIGHT EXTREMITY WITHOUT CONTRAST
3-DIMENSIONAL CT IMAGE RENDERING ON ACQUISITION WORKSTATION
TECHNIQUE: Multidetector CT imaging of the upper right extremity was performed
according to the standard protocol.

[Series 3183: spin · 0.58mm/px · 1 of 3 slices shown]
[im 2/3]
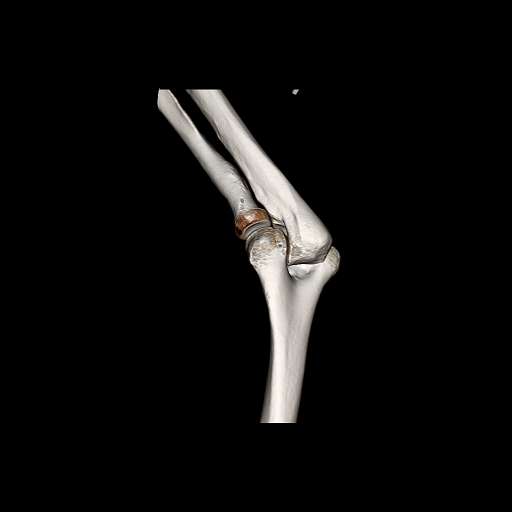

[1 of 3 positions shown; findings below may reference images not displayed]

RADIATION DOSE REDUCTION: This exam was performed according to the
departmental dose-optimization program which includes automated
exposure control, adjustment of the mA and/or kV according to
patient size and/or use of iterative reconstruction technique.

3-dimensional CT images were rendered by post-processing of the
original CT data on an acquisition workstation. The 3-dimensional CT
images were interpreted and findings were reported in the
accompanying complete CT report for this study
FINDINGS: Bones/Joint/Cartilage

There is no evidence of acute fracture. Alignment is normal. No
significant arthropathy.

Ligaments

Suboptimally assessed by CT.

Muscles and Tendons

No acute myotendinous abnormality by CT.

Soft tissues

No focal fluid collection.
IMPRESSION: Normal right elbow CT.
